# Patient Record
Sex: Male | Born: 1970
Health system: Southern US, Community
[De-identification: ages and names within clinical notes are randomized; demographics above are authoritative.]

## PROBLEM LIST (undated history)

## (undated) DIAGNOSIS — I739 Peripheral vascular disease, unspecified: Secondary | ICD-10-CM

## (undated) DIAGNOSIS — J45909 Unspecified asthma, uncomplicated: Secondary | ICD-10-CM

## (undated) HISTORY — DX: Peripheral vascular disease, unspecified: I73.9

## (undated) HISTORY — PX: OTHER SURGICAL HISTORY: SHX169

## (undated) HISTORY — DX: Unspecified asthma, uncomplicated: J45.909

---

## 2005-05-31 ENCOUNTER — Emergency Department (HOSPITAL_COMMUNITY): Admission: EM | Admit: 2005-05-31 | Discharge: 2005-05-31 | Payer: Self-pay | Admitting: Family Medicine

## 2005-06-08 ENCOUNTER — Ambulatory Visit: Payer: Self-pay | Admitting: Family Medicine

## 2005-06-15 ENCOUNTER — Ambulatory Visit: Payer: Self-pay | Admitting: *Deleted

## 2005-07-22 ENCOUNTER — Inpatient Hospital Stay (HOSPITAL_COMMUNITY): Admission: EM | Admit: 2005-07-22 | Discharge: 2005-07-26 | Payer: Self-pay | Admitting: Emergency Medicine

## 2005-07-29 ENCOUNTER — Ambulatory Visit: Payer: Self-pay | Admitting: Family Medicine

## 2005-11-15 ENCOUNTER — Emergency Department (HOSPITAL_COMMUNITY): Admission: EM | Admit: 2005-11-15 | Discharge: 2005-11-15 | Payer: Self-pay | Admitting: Family Medicine

## 2005-11-26 ENCOUNTER — Ambulatory Visit: Payer: Self-pay | Admitting: Family Medicine

## 2005-12-15 ENCOUNTER — Ambulatory Visit: Payer: Self-pay | Admitting: Family Medicine

## 2005-12-28 ENCOUNTER — Ambulatory Visit: Payer: Self-pay | Admitting: Nurse Practitioner

## 2006-03-24 ENCOUNTER — Ambulatory Visit: Payer: Self-pay | Admitting: Family Medicine

## 2006-03-28 ENCOUNTER — Ambulatory Visit: Payer: Self-pay | Admitting: Family Medicine

## 2006-04-25 ENCOUNTER — Ambulatory Visit: Payer: Self-pay | Admitting: Family Medicine

## 2006-11-16 IMAGING — CR DG CHEST 2V
2 series · 2 of 2 positions shown · non-contrast
Comparison: none

CLINICAL DATA: Chest pain.  
 CHEST - 2 VIEW:

[w chest pa]
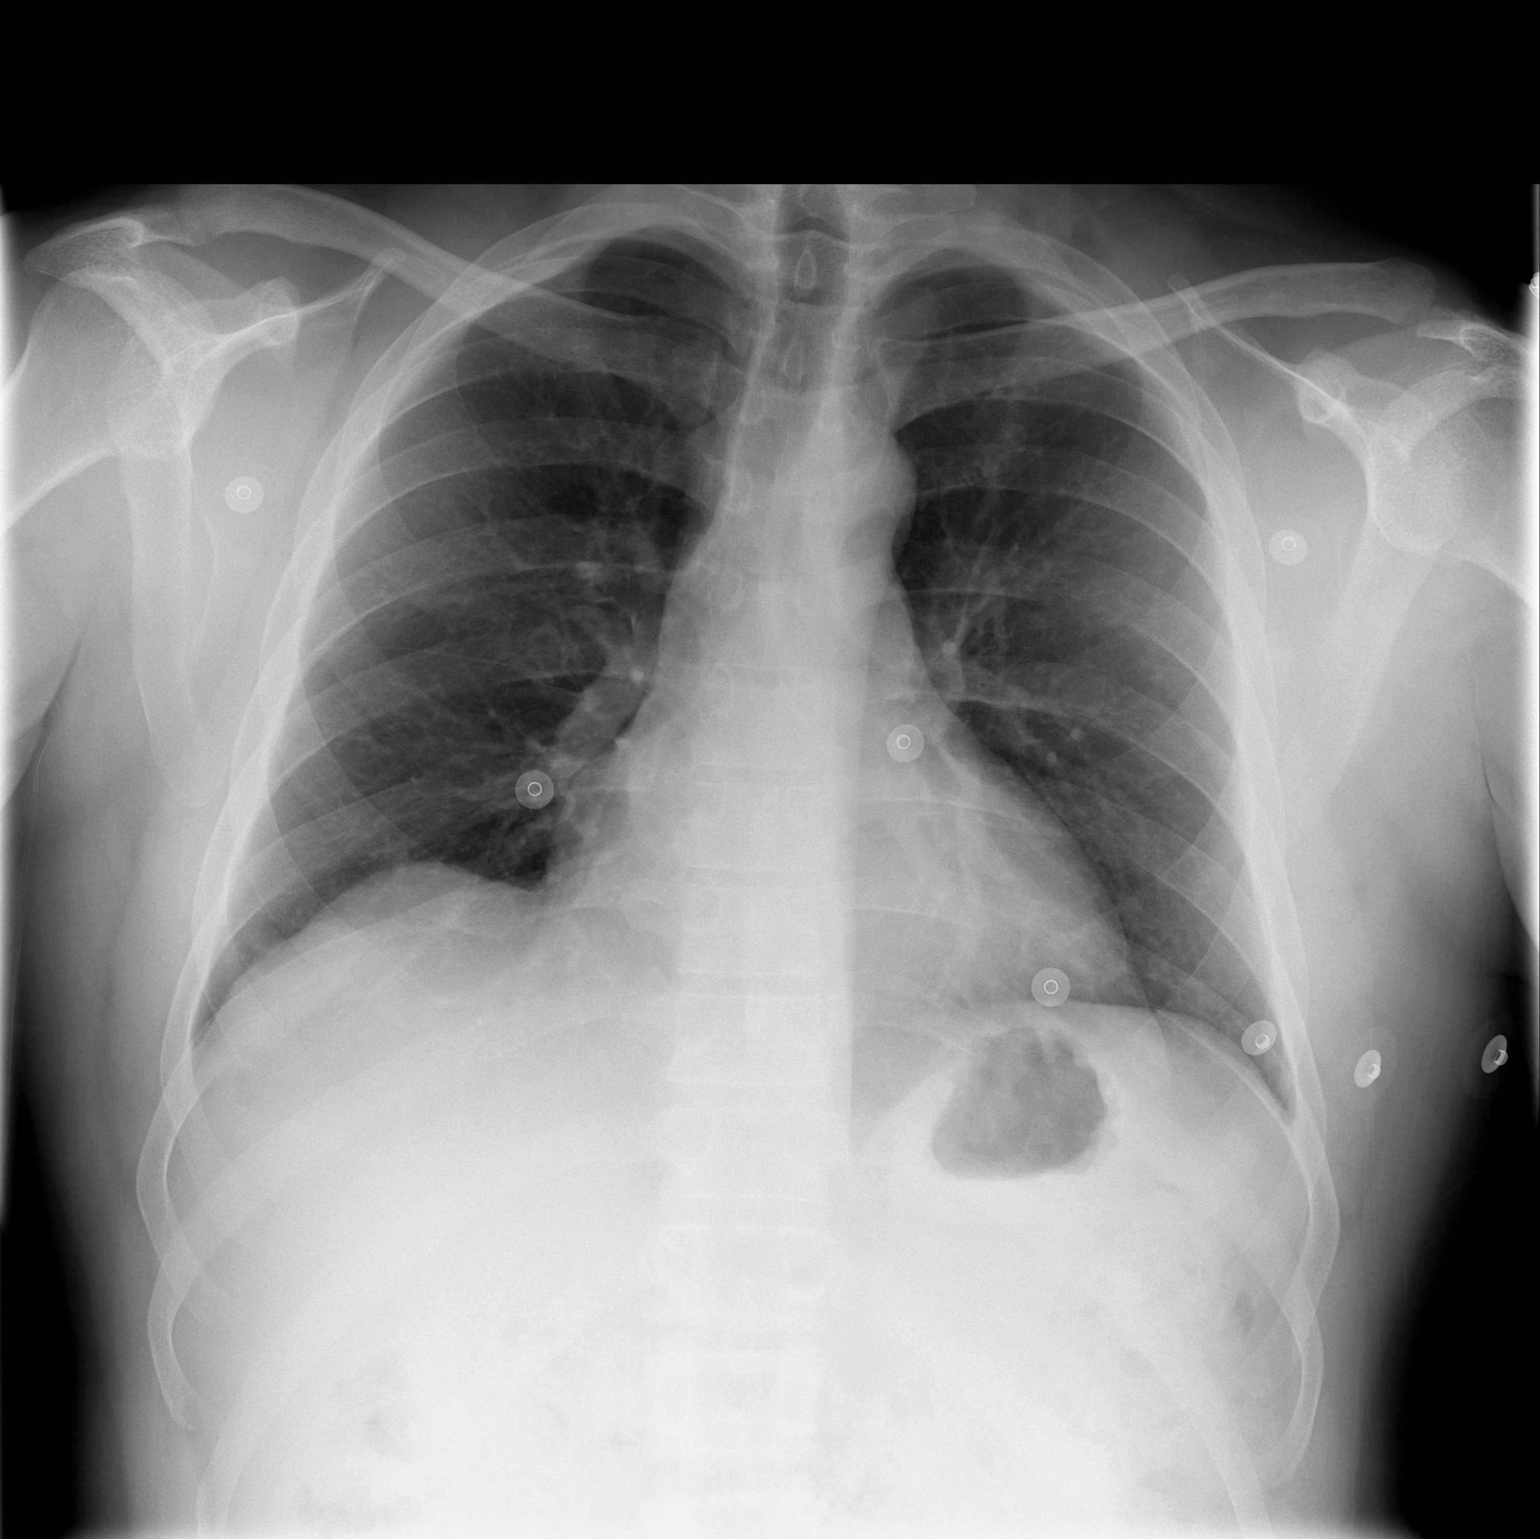

[w chest lat]
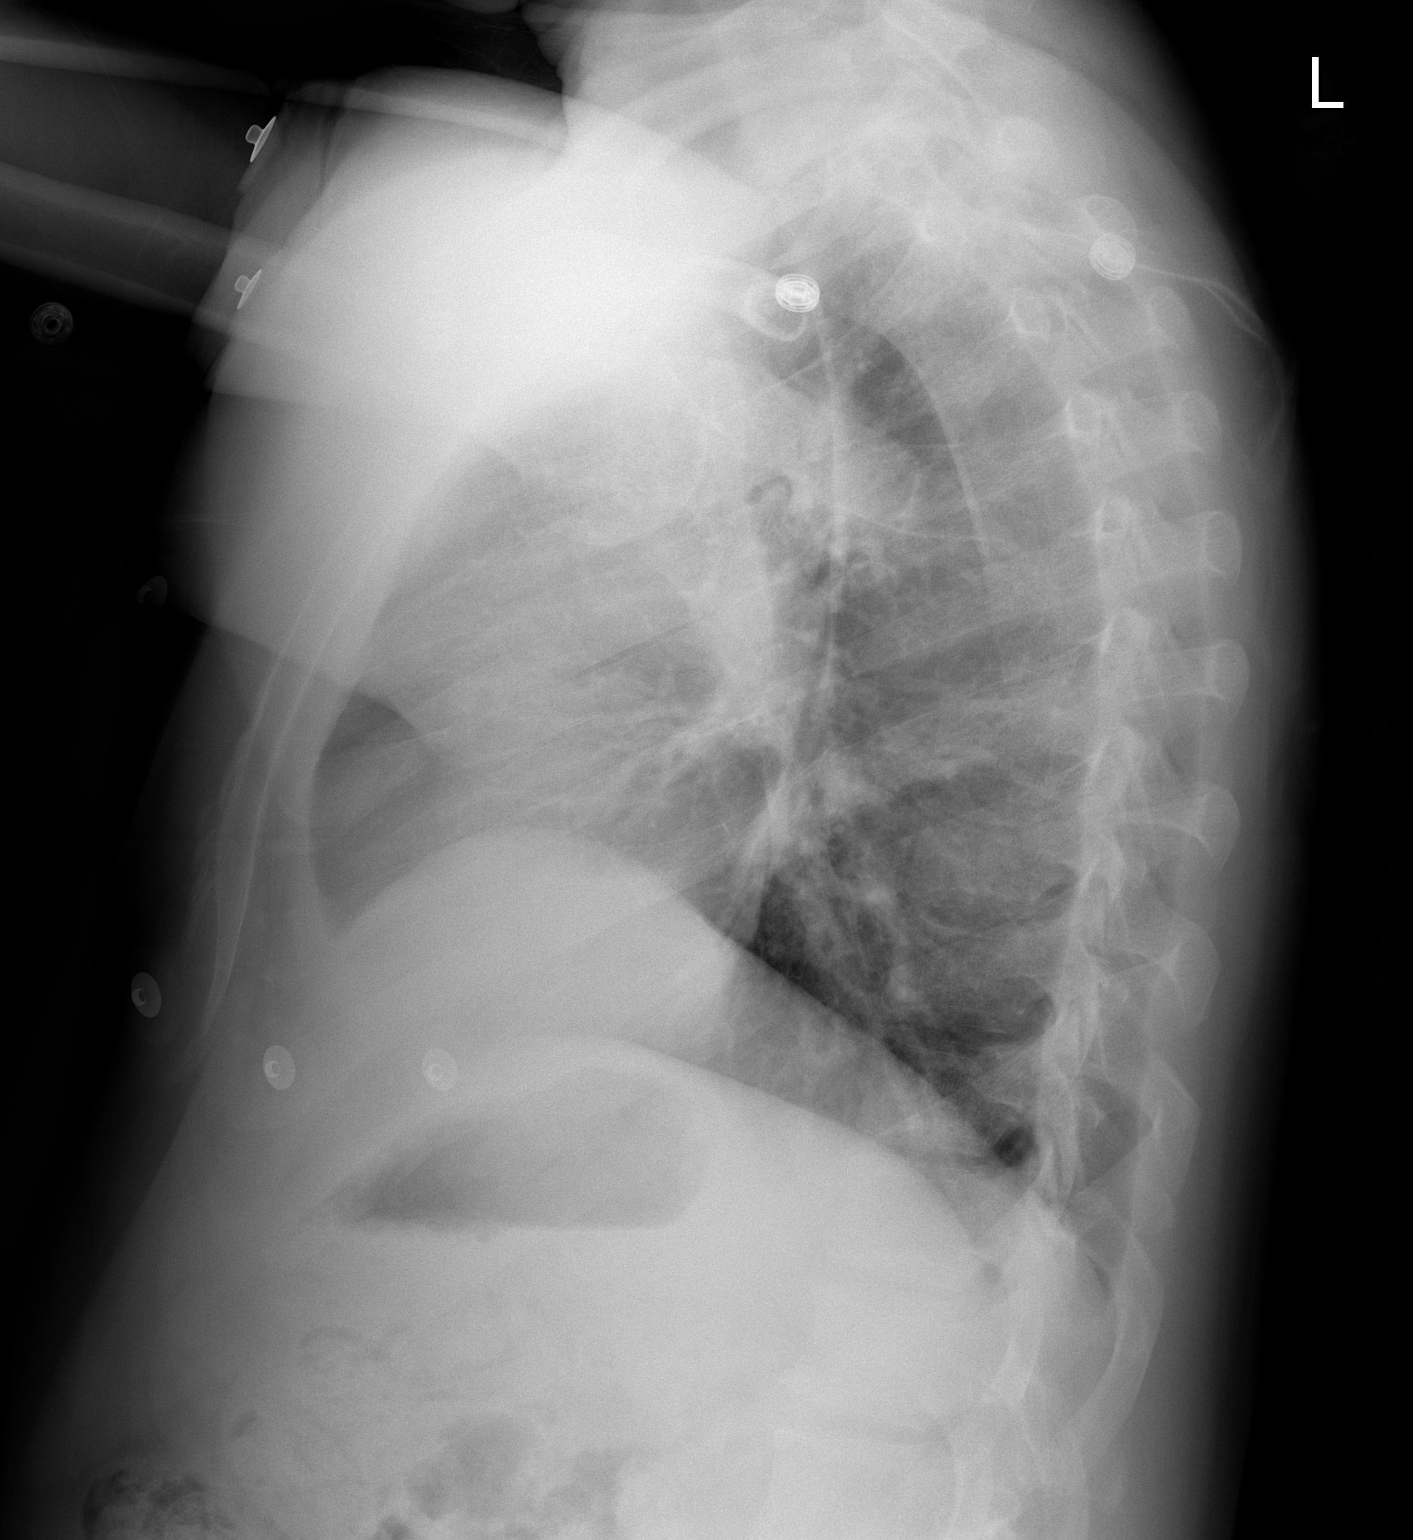

[2 of 2 positions shown; findings below may reference images not displayed]

FINDINGS: Lungs are clear.  There is cardiomegaly.  No pleural effusion.  No focal bony abnormality.
IMPRESSION: Cardiomegaly without acute cardiopulmonary disease.

## 2006-11-18 IMAGING — CT CT ANGIO CHEST
1 of 6 series · 13 of 30 positions shown · IV contrast (120 ML OMNI 300)
Comparison: none

CLINICAL DATA: Chest pain, dizziness

[Series 3: pe · axial · 0.70mm/px · z∈[-318,-74]mm · 13 of 464 slices shown]
[im 36/464  lung]
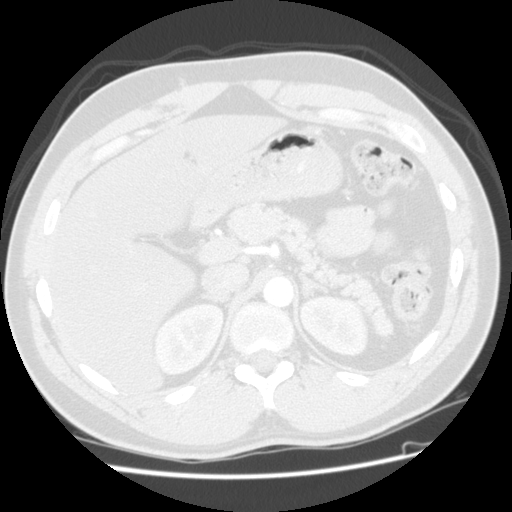
[im 72/464  mediastinal]
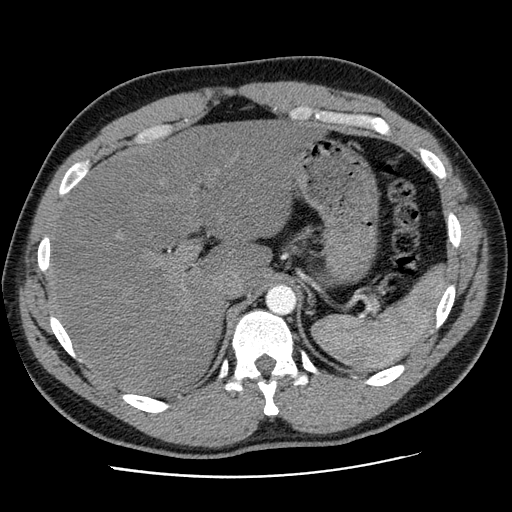
[im 107/464  lung]
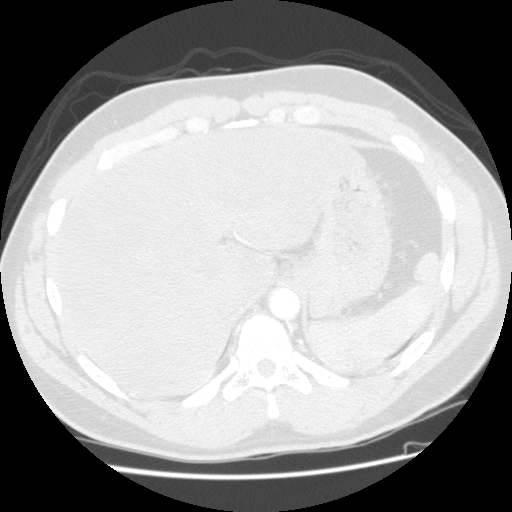
[im 143/464  mediastinal]
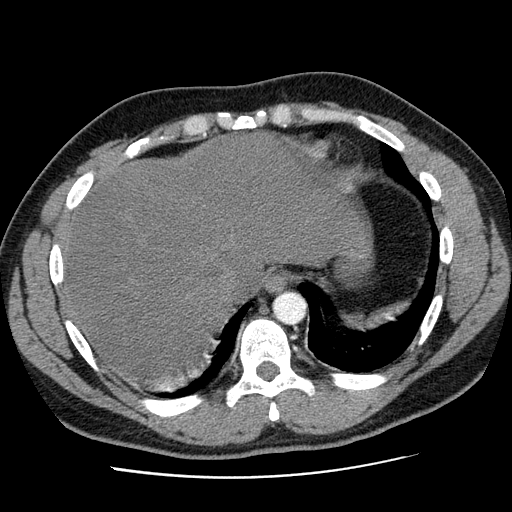
[im 179/464  lung]
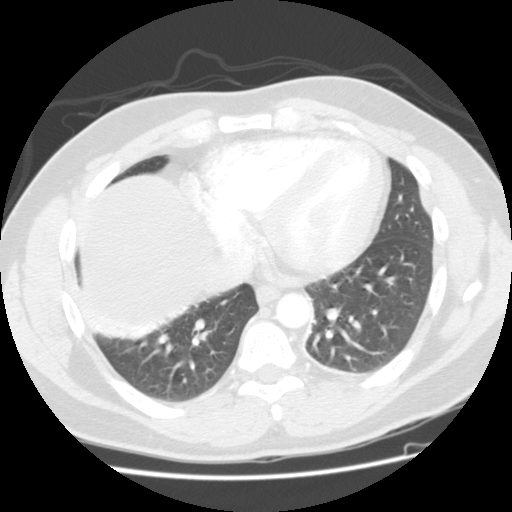
[im 214/464  mediastinal]
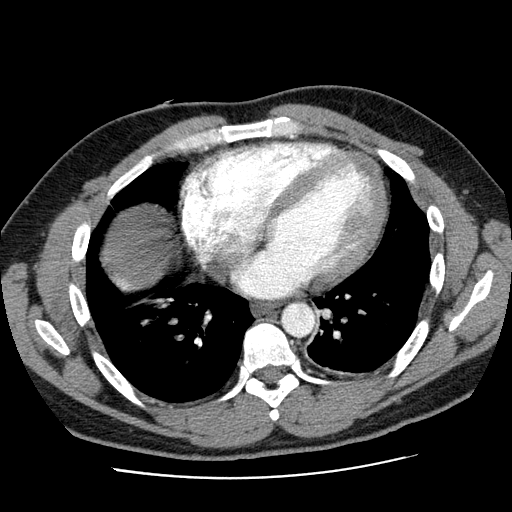
[im 217/464  lung]
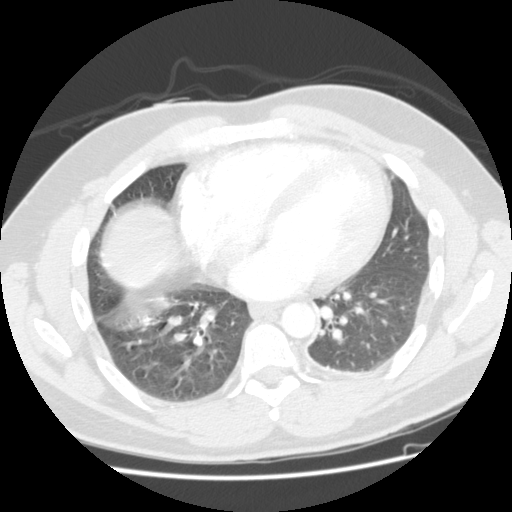
[im 250/464  mediastinal]
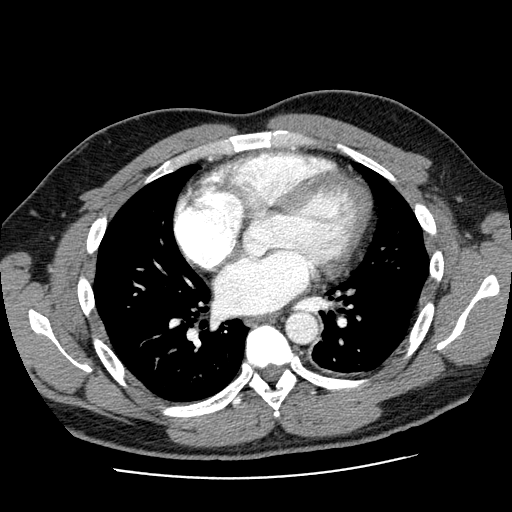
[im 285/464  lung]
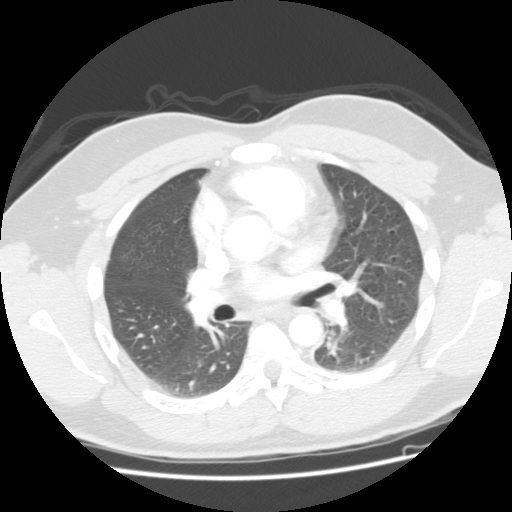
[im 321/464  mediastinal]
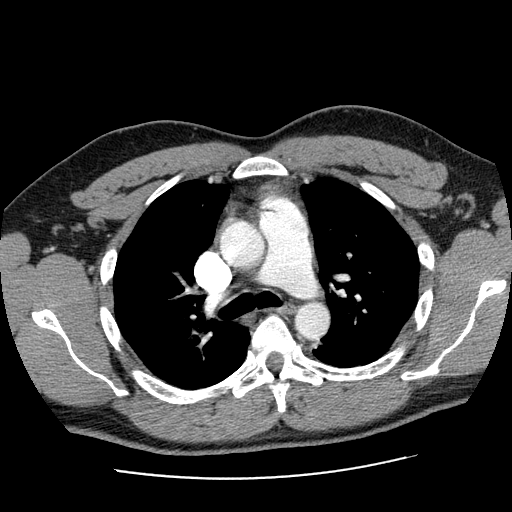
[im 357/464  lung]
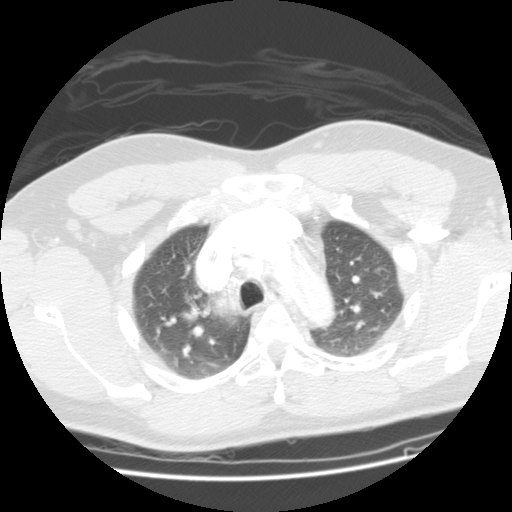
[im 392/464  mediastinal]
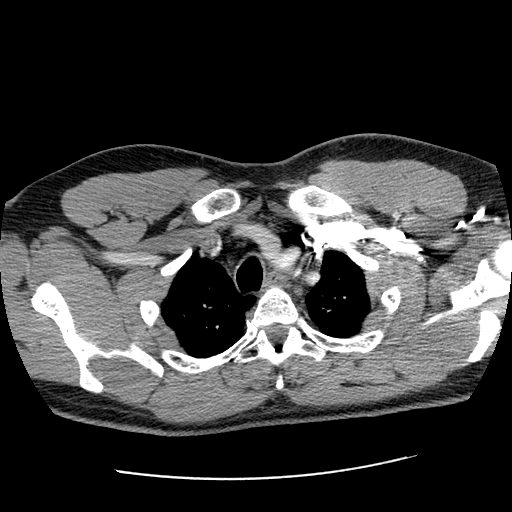
[im 428/464  lung]
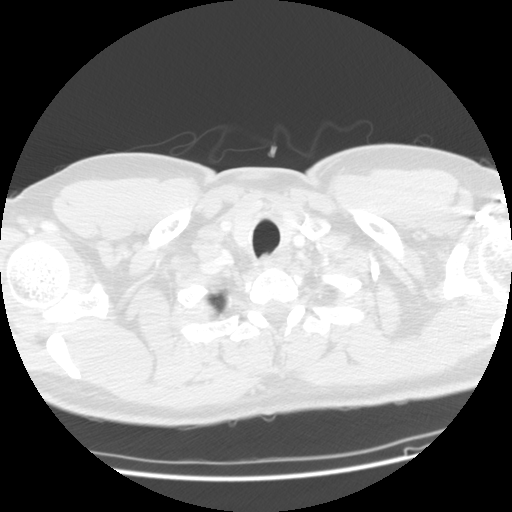

[13 of 30 positions shown; findings below may reference images not displayed]

CT angiogram chest with contrast:

Multidetector helical CT of the chest was obtained after 120 ml Omnipaque 300 
IV. CT multiplanar reconstructions were rendered to evaluate the vascular
anatomy. 
No previous for comparison. There is good contrast opacification of pulmonary
artery branches. Patient breathing degrades  some of the images. No convincing
filling defects to indicate acute PE. No pleural or pericardial effusion. No
hilar or mediastinal adenopathy. Mild   4 chamber cardiac enlargement.
Visualized upper abdomen shows a fatty liver. Linear scarring or atelectasis  
in the right middle lobe and anterior basal segment right lower lobe. Lungs are
otherwise clear. Coronal and sagittal reconstructions confirm the above
findings.
IMPRESSION: 1. Negative for acute PE.
2. Mild 4 chamber cardiac enlargement.
3. Fatty liver incidentally noted

## 2007-04-05 ENCOUNTER — Encounter (INDEPENDENT_AMBULATORY_CARE_PROVIDER_SITE_OTHER): Payer: Self-pay | Admitting: *Deleted

## 2017-09-29 DIAGNOSIS — E119 Type 2 diabetes mellitus without complications: Secondary | ICD-10-CM | POA: Diagnosis not present

## 2017-09-29 DIAGNOSIS — Z9114 Patient's other noncompliance with medication regimen: Secondary | ICD-10-CM | POA: Diagnosis not present

## 2017-09-29 DIAGNOSIS — Z720 Tobacco use: Secondary | ICD-10-CM | POA: Diagnosis not present

## 2017-09-29 DIAGNOSIS — R079 Chest pain, unspecified: Secondary | ICD-10-CM | POA: Diagnosis not present

## 2017-09-29 DIAGNOSIS — I10 Essential (primary) hypertension: Secondary | ICD-10-CM | POA: Diagnosis not present

## 2017-09-29 DIAGNOSIS — E785 Hyperlipidemia, unspecified: Secondary | ICD-10-CM | POA: Diagnosis not present

## 2017-09-30 DIAGNOSIS — E119 Type 2 diabetes mellitus without complications: Secondary | ICD-10-CM | POA: Diagnosis not present

## 2017-09-30 DIAGNOSIS — Z720 Tobacco use: Secondary | ICD-10-CM | POA: Diagnosis not present

## 2017-09-30 DIAGNOSIS — E785 Hyperlipidemia, unspecified: Secondary | ICD-10-CM | POA: Diagnosis not present

## 2017-09-30 DIAGNOSIS — I10 Essential (primary) hypertension: Secondary | ICD-10-CM | POA: Diagnosis not present

## 2017-09-30 DIAGNOSIS — R079 Chest pain, unspecified: Secondary | ICD-10-CM | POA: Diagnosis not present

## 2020-06-26 LAB — HM DIABETES EYE EXAM

## 2020-06-30 ENCOUNTER — Encounter: Payer: Self-pay | Admitting: Legal Medicine

## 2020-06-30 DIAGNOSIS — E782 Mixed hyperlipidemia: Secondary | ICD-10-CM

## 2020-06-30 DIAGNOSIS — I10 Essential (primary) hypertension: Secondary | ICD-10-CM

## 2020-06-30 DIAGNOSIS — E119 Type 2 diabetes mellitus without complications: Secondary | ICD-10-CM

## 2020-06-30 HISTORY — DX: Essential (primary) hypertension: I10

## 2020-06-30 HISTORY — DX: Mixed hyperlipidemia: E78.2

## 2020-06-30 HISTORY — DX: Type 2 diabetes mellitus without complications: E11.9

## 2020-07-03 ENCOUNTER — Encounter: Payer: Self-pay | Admitting: Legal Medicine

## 2020-08-07 ENCOUNTER — Encounter: Payer: Self-pay | Admitting: Legal Medicine

## 2020-08-07 ENCOUNTER — Other Ambulatory Visit: Payer: Self-pay

## 2020-08-07 ENCOUNTER — Ambulatory Visit: Payer: BC Managed Care – PPO | Admitting: Legal Medicine

## 2020-08-07 VITALS — BP 140/84 | HR 118 | Temp 97.5°F | Resp 16 | Ht 70.0 in | Wt 199.4 lb

## 2020-08-07 DIAGNOSIS — E782 Mixed hyperlipidemia: Secondary | ICD-10-CM | POA: Diagnosis not present

## 2020-08-07 DIAGNOSIS — I10 Essential (primary) hypertension: Secondary | ICD-10-CM

## 2020-08-07 DIAGNOSIS — F1210 Cannabis abuse, uncomplicated: Secondary | ICD-10-CM | POA: Insufficient documentation

## 2020-08-07 DIAGNOSIS — E1142 Type 2 diabetes mellitus with diabetic polyneuropathy: Secondary | ICD-10-CM | POA: Diagnosis not present

## 2020-08-07 DIAGNOSIS — F1411 Cocaine abuse, in remission: Secondary | ICD-10-CM | POA: Insufficient documentation

## 2020-08-07 DIAGNOSIS — N400 Enlarged prostate without lower urinary tract symptoms: Secondary | ICD-10-CM

## 2020-08-07 DIAGNOSIS — J45909 Unspecified asthma, uncomplicated: Secondary | ICD-10-CM | POA: Insufficient documentation

## 2020-08-07 DIAGNOSIS — J4520 Mild intermittent asthma, uncomplicated: Secondary | ICD-10-CM

## 2020-08-07 HISTORY — DX: Cocaine abuse, in remission: F14.11

## 2020-08-07 HISTORY — DX: Cannabis abuse, uncomplicated: F12.10

## 2020-08-07 MED ORDER — LISINOPRIL 10 MG PO TABS: 10.0000 mg | ORAL_TABLET | Freq: Every day | ORAL | 3 refills | Status: DC

## 2020-08-07 NOTE — Progress Notes (Signed)
New Patient Office Visit  Subjective:  Patient ID: Clayton Everett, male    DOB: Jul 24, 1970  Age: 50 y.o. MRN: [id number]  CC:  Chief Complaint  Patient presents with   New Patient (Initial Visit)    Patient here to re-etsablish.    HPI Clayton Everett presents for new patient with diabetes, hypertension ans smoking on no medicines.  He has a history of type 2 diabetes 2017, he is not on any medicines and no hospitalization  He is despondant today..needs PHQ9  Patient presents for follow up of hypertension.  Patient tolerating none well with side effects.  Patient was diagnosed with hypertension 2010 so has been treated for hypertension for 10 years.Patient is working on maintaining diet and exercise regimen and follows up as directed. Complication include none  .  Past Medical History:  Diagnosis Date   Asthma    Benign essential hypertension 06/30/2020   Diabetes mellitus, type 2 (HCC) 06/30/2020   Mixed hyperlipidemia 06/30/2020    Past Surgical History:  Procedure Laterality Date   none      Family History  Problem Relation Age of Onset   Congestive Heart Failure Mother    Diabetes type II Other    COPD Other    Hypertension Other    Congestive Heart Failure Other    Diabetes Father     Social History   Socioeconomic History   Marital status: Legally Separated    Spouse name: Not on file   Number of children: Not on file   Years of education: Not on file   Highest education level: Not on file  Occupational History   Occupation: Designer, television/film set  Tobacco Use   Smoking status: Current Some Day Smoker    Packs/day: 1.00    Types: Cigarettes   Smokeless tobacco: Never Used  Substance and Sexual Activity   Alcohol use: Yes    Alcohol/week: 1.0 standard drink    Types: 1 Cans of beer per week    Comment: weekly    Drug use: Never   Sexual activity: Not on file  Other Topics Concern   Not on file  Social History Narrative   Not on  file   Social Determinants of Health   Financial Resource Strain: Not on file  Food Insecurity: Not on file  Transportation Needs: Not on file  Physical Activity: Not on file  Stress: Not on file  Social Connections: Not on file  Intimate Partner Violence: Not on file    ROS Review of Systems  Constitutional: Negative for activity change, appetite change and fatigue.  HENT: Negative for congestion, rhinorrhea and sore throat.   Eyes: Negative for visual disturbance.  Respiratory: Negative for chest tightness and shortness of breath.   Cardiovascular: Negative for chest pain, palpitations and leg swelling.  Gastrointestinal: Negative for abdominal distention and abdominal pain.  Endocrine: Negative for polyuria.  Genitourinary: Negative for difficulty urinating and dysuria.  Musculoskeletal: Negative for arthralgias and back pain.  Skin: Negative.   Neurological: Negative.   Hematological: Negative.   Psychiatric/Behavioral: Positive for dysphoric mood.    Objective:   Today's Vitals: BP 140/84 (BP Location: Left Arm, Patient Position: Sitting, Cuff Size: Normal)    Pulse (!) 118    Temp (!) 97.5 F (36.4 C) (Temporal)    Resp 16    Ht 5\' 10"  (1.778 m)    Wt 199 lb 6.4 oz (90.4 kg)    SpO2 95%    BMI 28.61 kg/m  Physical Exam Vitals reviewed.  Constitutional:      General: He is not in acute distress.    Appearance: Normal appearance.  HENT:     Head: Normocephalic and atraumatic.     Right Ear: Tympanic membrane, ear canal and external ear normal.     Left Ear: Tympanic membrane, ear canal and external ear normal.     Mouth/Throat:     Mouth: Mucous membranes are moist.     Pharynx: Oropharynx is clear.  Eyes:     Extraocular Movements: Extraocular movements intact.     Conjunctiva/sclera: Conjunctivae normal.     Pupils: Pupils are equal, round, and reactive to light.     Comments: glasses  Cardiovascular:     Rate and Rhythm: Normal rate and regular rhythm.      Pulses: Normal pulses.     Heart sounds: Normal heart sounds.  Pulmonary:     Effort: Pulmonary effort is normal.     Breath sounds: Normal breath sounds.  Abdominal:     General: Abdomen is flat. Bowel sounds are normal. There is no distension.     Palpations: Abdomen is soft.     Tenderness: There is no abdominal tenderness.  Musculoskeletal:        General: No tenderness. Normal range of motion.     Cervical back: Normal range of motion.  Skin:    General: Skin is warm and dry.     Capillary Refill: Capillary refill takes less than 2 seconds.  Neurological:     Mental Status: He is alert and oriented to person, place, and time. Mental status is at baseline.     Sensory: Sensory deficit present.    Depression screen [facility] 2/9 08/07/2020  Decreased Interest 0  Down, Depressed, Hopeless 1  PHQ - 2 Score 1  Altered sleeping 1  Tired, decreased energy 1  Change in appetite 1  Feeling bad or failure about yourself  1  Trouble concentrating 1  Moving slowly or fidgety/restless 0  Suicidal thoughts 0  PHQ-9 Score 6  Difficult doing work/chores Not difficult at all     Assessment & Plan:   Diagnoses and all orders for this visit: Benign prostatic hyperplasia without lower urinary tract symptoms -     PSA AN INDIVIDUAL CARE PLAN for bph was established and reinforced today.  The patient's status was assessed using clinical findings on exam, labs, and other diagnostic testing. Patient's success at meeting treatment goals based on disease specific evidence-bassed guidelines and found to be in god control. RECOMMENDATIONS include maintining present medicines and treatment.  Mild intermittent asthma, unspecified whether complicated This patient has asthma mild and is on none.  Patient is not having a flair.  Chronic medicines include none. Addition new medicines none.  Asthma action plan is in place. He continues to smoke  Mixed hyperlipidemia -     Lipid panel AN INDIVIDUAL CARE  PLAN for hyperlipidemia/ cholesterol was established and reinforced today.  The patient's status was assessed using clinical findings on exam, lab and other diagnostic tests. The patient's disease status was assessed based on evidence-based guidelines and found to be poor controlled. MEDICATIONS were restarted. SELF MANAGEMENT GOALS have been discussed and patient's success at attaining the goal of low cholesterol was assessed. RECOMMENDATION given include regular exercise 3 days a week and low cholesterol/low fat diet. CLINICAL SUMMARY including written plan to identify barriers unique to the patient due to social or economic  reasons was discussed.  Benign  essential hypertension -     CBC with Differential/Platelet -     Comprehensive metabolic panel -     lisinopril (ZESTRIL) 10 MG tablet; Take 1 tablet (10 mg total) by mouth daily. An individual hypertension care plan was established and reinforced today.  The patient's status was assessed using clinical findings on exam and labs or diagnostic tests. The patient's success at meeting treatment goals on disease specific evidence-based guidelines and found to be poor controlled. Restarted BP medicines SELF MANAGEMENT: The patient and I together assessed ways to personally work towards obtaining the recommended goals. RECOMMENDATIONS: avoid decongestants found in common cold remedies, decrease consumption of alcohol, perform routine monitoring of BP with home BP cuff, exercise, reduction of dietary salt, take medicines as prescribed, try not to miss doses and quit smoking.  Regular exercise and maintaining a healthy weight is needed.  Stress reduction may help. A CLINICAL SUMMARY including written plan identify barriers to care unique to individual due to social or financial issues.  We attempt to mutually creat solutions for individual and family understanding.  Type 2 diabetes mellitus with diabetic polyneuropathy, without long-term current use of  insulin (HCC) -     Hemoglobin A1c An individual care plan for diabetes was established and reinforced today.  The patient's status was assessed using clinical findings on exam, labs and diagnostic testing. Patient success at meeting goals based on disease specific evidence-based guidelines and found to be poor controlled. Medications were assessed and patient's understanding of the medical issues , including barriers were assessed. started on metformin, I gave information on diet and diabetes care. Nurse discused DM Recommend adherence to a diabetic diet, a graduated exercise program, HgbA1c level is checked quarterly, and urine microalbumin performed yearly .  Annual mono-filament sensation testing performed. Lower blood pressure and control hyperlipidemia is important. Get annual eye exams and annual flu shots and smoking cessation discussed.  Self management goals were discussed.  Marijuana abuse, continuous He continues to smoke marijuana which may be why he is not concerned about his medical probes  Cocaine abuse in remission [facility]) He says he is in remission for 14 years, he does not go to NA or AA we discused Other orders -     Cardiovascular Risk Assessment   Outpatient Encounter Medications as of 08/07/2020  Medication Sig   lisinopril (ZESTRIL) 10 MG tablet Take 1 tablet (10 mg total) by mouth daily.   No facility-administered encounter medications on file as of 08/07/2020.    Follow-up: Return in about 1 month (around 09/07/2020).   Brent Bulla, MD

## 2020-08-07 NOTE — Patient Instructions (Signed)
Diabetes Mellitus and Foot Care Foot care is an important part of your health, especially when you have diabetes. Diabetes may cause you to have problems because of poor blood flow (circulation) to your feet and legs, which can cause your skin to:  Become thinner and drier.  Break more easily.  Heal more slowly.  Peel and crack. You may also have nerve damage (neuropathy) in your legs and feet, causing decreased feeling in them. This means that you may not notice minor injuries to your feet that could lead to more serious problems. Noticing and addressing any potential problems early is the best way to prevent future foot problems. How to care for your feet Foot hygiene  Wash your feet daily with warm water and mild soap. Do not use hot water. Then, pat your feet and the areas between your toes until they are completely dry. Do not soak your feet as this can dry your skin.  Trim your toenails straight across. Do not dig under them or around the cuticle. File the edges of your nails with an emery board or nail file.  Apply a moisturizing lotion or petroleum jelly to the skin on your feet and to dry, brittle toenails. Use lotion that does not contain alcohol and is unscented. Do not apply lotion between your toes.   Shoes and socks  Wear clean socks or stockings every day. Make sure they are not too tight. Do not wear knee-high stockings since they may decrease blood flow to your legs.  Wear shoes that fit properly and have enough cushioning. Always look in your shoes before you put them on to be sure there are no objects inside.  To break in new shoes, wear them for just a few hours a day. This prevents injuries on your feet. Wounds, scrapes, corns, and calluses  Check your feet daily for blisters, cuts, bruises, sores, and redness. If you cannot see the bottom of your feet, use a mirror or ask someone for help.  Do not cut corns or calluses or try to remove them with medicine.  If you  find a minor scrape, cut, or break in the skin on your feet, keep it and the skin around it clean and dry. You may clean these areas with mild soap and water. Do not clean the area with peroxide, alcohol, or iodine.  If you have a wound, scrape, corn, or callus on your foot, look at it several times a day to make sure it is healing and not infected. Check for: ? Redness, swelling, or pain. ? Fluid or blood. ? Warmth. ? Pus or a bad smell.   General tips  Do not cross your legs. This may decrease blood flow to your feet.  Do not use heating pads or hot water bottles on your feet. They may burn your skin. If you have lost feeling in your feet or legs, you may not know this is happening until it is too late.  Protect your feet from hot and cold by wearing shoes, such as at the beach or on hot pavement.  Schedule a complete foot exam at least once a year (annually) or more often if you have foot problems. Report any cuts, sores, or bruises to your health care provider immediately. Where to find more information  American Diabetes Association: www.diabetes.org  Association of Diabetes Care & Education Specialists: www.diabeteseducator.org Contact a health care provider if:  You have a medical condition that increases your risk of infection and   you have any cuts, sores, or bruises on your feet.  You have an injury that is not healing.  You have redness on your legs or feet.  You feel burning or tingling in your legs or feet.  You have pain or cramps in your legs and feet.  Your legs or feet are numb.  Your feet always feel cold.  You have pain around any toenails. Get help right away if:  You have a wound, scrape, corn, or callus on your foot and: ? You have pain, swelling, or redness that gets worse. ? You have fluid or blood coming from the wound, scrape, corn, or callus. ? Your wound, scrape, corn, or callus feels warm to the touch. ? You have pus or a bad smell coming from  the wound, scrape, corn, or callus. ? You have a fever. ? You have a red line going up your leg. Summary  Check your feet every day for blisters, cuts, bruises, sores, and redness.  Apply a moisturizing lotion or petroleum jelly to the skin on your feet and to dry, brittle toenails.  Wear shoes that fit properly and have enough cushioning.  If you have foot problems, report any cuts, sores, or bruises to your health care provider immediately.  Schedule a complete foot exam at least once a year (annually) or more often if you have foot problems. This information is not intended to replace advice given to you by your health care provider. Make sure you discuss any questions you have with your health care provider. Document Revised: 01/24/2020 Document Reviewed: 01/24/2020 Elsevier Patient Education  2021 Elsevier Inc.  

## 2020-08-08 ENCOUNTER — Other Ambulatory Visit: Payer: Self-pay | Admitting: Legal Medicine

## 2020-08-08 DIAGNOSIS — E782 Mixed hyperlipidemia: Secondary | ICD-10-CM

## 2020-08-08 DIAGNOSIS — E119 Type 2 diabetes mellitus without complications: Secondary | ICD-10-CM

## 2020-08-08 LAB — LIPID PANEL
Chol/HDL Ratio: 4.3 ratio (ref 0.0–5.0)
Cholesterol, Total: 158 mg/dL (ref 100–199)
HDL: 37 mg/dL — ABNORMAL LOW (ref >39)
LDL Chol Calc (NIH): 69 mg/dL (ref 0–99)
Triglycerides: 327 mg/dL — ABNORMAL HIGH (ref 0–149)
VLDL Cholesterol Cal: 52 mg/dL — ABNORMAL HIGH (ref 5–40)

## 2020-08-08 LAB — COMPREHENSIVE METABOLIC PANEL
ALT: 37 IU/L (ref 0–44)
AST: 22 IU/L (ref 0–40)
Albumin/Globulin Ratio: 1.7 (ref 1.2–2.2)
Albumin: 4.5 g/dL (ref 4.0–5.0)
Alkaline Phosphatase: 98 IU/L (ref 44–121)
BUN/Creatinine Ratio: 10 (ref 9–20)
BUN: 10 mg/dL (ref 6–24)
Bilirubin Total: <0.2 mg/dL (ref 0.0–1.2)
CO2: 22 mmol/L (ref 20–29)
Calcium: 9.1 mg/dL (ref 8.7–10.2)
Chloride: 101 mmol/L (ref 96–106)
Creatinine, Ser: 1.01 mg/dL (ref 0.76–1.27)
GFR calc Af Amer: 100 mL/min/{1.73_m2} (ref >59)
GFR calc non Af Amer: 86 mL/min/{1.73_m2} (ref >59)
Globulin, Total: 2.6 g/dL (ref 1.5–4.5)
Glucose: 460 mg/dL — ABNORMAL HIGH (ref 65–99)
Potassium: 4.4 mmol/L (ref 3.5–5.2)
Sodium: 138 mmol/L (ref 134–144)
Total Protein: 7.1 g/dL (ref 6.0–8.5)

## 2020-08-08 LAB — CBC WITH DIFFERENTIAL/PLATELET
Basophils Absolute: 0.1 10*3/uL (ref 0.0–0.2)
Basos: 1 %
EOS (ABSOLUTE): 0.1 10*3/uL (ref 0.0–0.4)
Eos: 1 %
Hematocrit: 48.9 % (ref 37.5–51.0)
Hemoglobin: 16.2 g/dL (ref 13.0–17.7)
Immature Grans (Abs): 0 10*3/uL (ref 0.0–0.1)
Immature Granulocytes: 1 %
Lymphocytes Absolute: 2.3 10*3/uL (ref 0.7–3.1)
Lymphs: 46 %
MCH: 29.3 pg (ref 26.6–33.0)
MCHC: 33.1 g/dL (ref 31.5–35.7)
MCV: 89 fL (ref 79–97)
Monocytes Absolute: 0.5 10*3/uL (ref 0.1–0.9)
Monocytes: 10 %
Neutrophils Absolute: 2.1 10*3/uL (ref 1.4–7.0)
Neutrophils: 41 %
Platelets: 175 10*3/uL (ref 150–450)
RBC: 5.52 x10E6/uL (ref 4.14–5.80)
RDW: 11.7 % (ref 11.6–15.4)
WBC: 5 10*3/uL (ref 3.4–10.8)

## 2020-08-08 LAB — CARDIOVASCULAR RISK ASSESSMENT

## 2020-08-08 LAB — HEMOGLOBIN A1C
Est. average glucose Bld gHb Est-mCnc: 301 mg/dL
Hgb A1c MFr Bld: 12.1 % — ABNORMAL HIGH (ref 4.8–5.6)

## 2020-08-08 LAB — PSA: Prostate Specific Ag, Serum: 0.3 ng/mL (ref 0.0–4.0)

## 2020-08-08 MED ORDER — METFORMIN HCL 500 MG PO TABS
500.0000 mg | ORAL_TABLET | Freq: Two times a day (BID) | ORAL | 3 refills | Status: DC
Start: 2020-08-08 — End: 2020-09-08

## 2020-08-08 MED ORDER — PRAVASTATIN SODIUM 40 MG PO TABS: 40.0000 mg | ORAL_TABLET | Freq: Every day | ORAL | 3 refills | Status: DC

## 2020-08-08 NOTE — Progress Notes (Signed)
CBC normal, glucose 460, trigycerides high 327, psa 0.3, hemoglobin A1c 12.1 extremely poor control of diabetes.Marland Kitchen at this range we start thinking about insulin.  We will start metformin and cholesterol medicines lp

## 2020-08-11 NOTE — Progress Notes (Signed)
all these were called in on 08/08/20 lp

## 2020-09-08 ENCOUNTER — Encounter: Payer: Self-pay | Admitting: Legal Medicine

## 2020-09-08 ENCOUNTER — Ambulatory Visit: Payer: BC Managed Care – PPO | Admitting: Legal Medicine

## 2020-09-08 ENCOUNTER — Other Ambulatory Visit: Payer: Self-pay

## 2020-09-08 DIAGNOSIS — E1169 Type 2 diabetes mellitus with other specified complication: Secondary | ICD-10-CM

## 2020-09-08 DIAGNOSIS — E119 Type 2 diabetes mellitus without complications: Secondary | ICD-10-CM | POA: Diagnosis not present

## 2020-09-08 DIAGNOSIS — I152 Hypertension secondary to endocrine disorders: Secondary | ICD-10-CM

## 2020-09-08 DIAGNOSIS — E1159 Type 2 diabetes mellitus with other circulatory complications: Secondary | ICD-10-CM | POA: Diagnosis not present

## 2020-09-08 DIAGNOSIS — E669 Obesity, unspecified: Secondary | ICD-10-CM

## 2020-09-08 HISTORY — DX: Obesity, unspecified: E66.9

## 2020-09-08 HISTORY — DX: Hypertension secondary to endocrine disorders: I15.2

## 2020-09-08 MED ORDER — METFORMIN HCL 1000 MG PO TABS: 500.0000 mg | ORAL_TABLET | Freq: Two times a day (BID) | ORAL | 2 refills | Status: DC

## 2020-09-08 NOTE — Progress Notes (Signed)
Subjective:  Patient ID: Clayton Everett, male    DOB: 10/04/1970  Age: 50 y.o. MRN: [id number]  Chief Complaint  Patient presents with  . Diabetes    HPI: new diabetes, yesterday BS 334 and 188. Today 234  Patient present with type 2 diabetes.  Specifically, this is type 2, noninsulin requiring diabetes, complicated by hypertension and hypercholesterolemia.  Compliance with treatment has been good; patient take medicines as directed, maintains diet and exercise regimen, follows up as directed, and is keeping glucose diary.  Date of  diagnosis 2022.  Depression screen has been performed.Tobacco screen nonsmoker. Current medicines for diabetes metformin.  Patient is on lisinopril for renal protection and pravastatin for cholesterol control.  Patient performs foot exams daily and last ophthalmologic exam was no. Current Outpatient Medications on File Prior to Visit  Medication Sig Dispense Refill  . lisinopril (ZESTRIL) 10 MG tablet Take 1 tablet (10 mg total) by mouth daily. 90 tablet 3  . pravastatin (PRAVACHOL) 40 MG tablet Take 1 tablet (40 mg total) by mouth daily. 90 tablet 3   No current facility-administered medications on file prior to visit.   Past Medical History:  Diagnosis Date  . Asthma   . Benign essential hypertension 06/30/2020  . Diabetes mellitus, type 2 (HCC) 06/30/2020  . Mixed hyperlipidemia 06/30/2020   Past Surgical History:  Procedure Laterality Date  . none      Family History  Problem Relation Age of Onset  . Congestive Heart Failure Mother   . Diabetes type II Other   . COPD Other   . Hypertension Other   . Congestive Heart Failure Other   . Diabetes Father    Social History   Socioeconomic History  . Marital status: Legally Separated    Spouse name: Not on file  . Number of children: Not on file  . Years of education: Not on file  . Highest education level: Not on file  Occupational History  . Occupation: Designer, television/film set  Tobacco Use  . Smoking  status: Current Some Day Smoker    Packs/day: 1.00    Types: Cigarettes  . Smokeless tobacco: Never Used  Substance and Sexual Activity  . Alcohol use: Yes    Alcohol/week: 1.0 standard drink    Types: 1 Cans of beer per week    Comment: weekly   . Drug use: Never  . Sexual activity: Not on file  Other Topics Concern  . Not on file  Social History Narrative  . Not on file   Social Determinants of Health   Financial Resource Strain: Not on file  Food Insecurity: Not on file  Transportation Needs: Not on file  Physical Activity: Not on file  Stress: Not on file  Social Connections: Not on file    Review of Systems  Constitutional: Negative for activity change and appetite change.  HENT: Negative for congestion, sneezing and sore throat.   Eyes: Negative for visual disturbance.  Respiratory: Negative for chest tightness and shortness of breath.   Cardiovascular: Negative for chest pain, palpitations and leg swelling.  Gastrointestinal: Negative for abdominal distention, abdominal pain and anal bleeding.  Endocrine: Negative for polyuria.  Genitourinary: Negative for difficulty urinating and frequency.  Musculoskeletal: Negative for arthralgias and back pain.  Skin: Negative.   Neurological: Negative.   Psychiatric/Behavioral: Negative.      Objective:  BP 120/78 (BP Location: Left Arm, Patient Position: Sitting, Cuff Size: Normal)   Pulse (!) 102   Temp 97.8 F (36.6 C) (  Temporal)   Resp 16   Ht 5\' 10"  (1.778 m)   Wt 205 lb 3.2 oz (93.1 kg)   SpO2 98%   BMI 29.44 kg/m   BP/Weight 09/08/2020 08/07/2020  Systolic BP 120 140  Diastolic BP 78 84  Wt. (Lbs) 205.2 199.4  BMI 29.44 28.61    Physical Exam Vitals reviewed.  Constitutional:      General: He is not in acute distress.    Appearance: Normal appearance.  HENT:     Head: Normocephalic and atraumatic.     Right Ear: Tympanic membrane, ear canal and external ear normal.     Left Ear: Tympanic membrane,  ear canal and external ear normal.     Mouth/Throat:     Mouth: Mucous membranes are moist.     Pharynx: Oropharynx is clear.  Eyes:     Extraocular Movements: Extraocular movements intact.     Conjunctiva/sclera: Conjunctivae normal.     Pupils: Pupils are equal, round, and reactive to light.  Cardiovascular:     Rate and Rhythm: Normal rate and regular rhythm.     Pulses: Normal pulses.     Heart sounds: Normal heart sounds. No murmur heard. No gallop.   Pulmonary:     Effort: Pulmonary effort is normal. No respiratory distress.     Breath sounds: Normal breath sounds. No rales.  Abdominal:     General: Abdomen is flat. Bowel sounds are normal. There is no distension.     Palpations: Abdomen is soft.     Tenderness: There is no abdominal tenderness.  Musculoskeletal:     Cervical back: Normal range of motion and neck supple.  Skin:    General: Skin is warm and dry.     Capillary Refill: Capillary refill takes less than 2 seconds.  Neurological:     General: No focal deficit present.     Mental Status: He is alert and oriented to person, place, and time. Mental status is at baseline.       Lab Results  Component Value Date   WBC 5.0 08/07/2020   HGB 16.2 08/07/2020   HCT 48.9 08/07/2020   PLT 175 08/07/2020   GLUCOSE 460 (H) 08/07/2020   CHOL 158 08/07/2020   TRIG 327 (H) 08/07/2020   HDL 37 (L) 08/07/2020   LDLCALC 69 08/07/2020   ALT 37 08/07/2020   AST 22 08/07/2020   NA 138 08/07/2020   K 4.4 08/07/2020   CL 101 08/07/2020   CREATININE 1.01 08/07/2020   BUN 10 08/07/2020   CO2 22 08/07/2020   HGBA1C 12.1 (H) 08/07/2020      Assessment & Plan:   1. Obesity, diabetes, and hypertension syndrome (HCC) Patient BS is improving.  Need to increased to metformin 1000mg  bid.  Follow up 2 months is stable.         I spent 20 minutes dedicated to the care of this patient on the date of this encounter to include face-to-face time with the patient, as well  as:  Follow-up: Return in about 2 months (around 11/06/2020) for fasting.  An After Visit Summary was printed and given to the patient.  , MD [facility] [phone]

## 2020-11-06 ENCOUNTER — Ambulatory Visit: Payer: BC Managed Care – PPO | Admitting: Legal Medicine

## 2020-11-07 ENCOUNTER — Other Ambulatory Visit: Payer: Self-pay

## 2020-11-07 ENCOUNTER — Encounter: Payer: Self-pay | Admitting: Legal Medicine

## 2020-11-07 ENCOUNTER — Ambulatory Visit: Payer: BC Managed Care – PPO | Admitting: Legal Medicine

## 2020-11-07 VITALS — BP 136/84 | HR 87 | Temp 96.4°F | Ht 64.0 in | Wt 204.0 lb

## 2020-11-07 DIAGNOSIS — Z716 Tobacco abuse counseling: Secondary | ICD-10-CM

## 2020-11-07 DIAGNOSIS — I10 Essential (primary) hypertension: Secondary | ICD-10-CM

## 2020-11-07 DIAGNOSIS — E1169 Type 2 diabetes mellitus with other specified complication: Secondary | ICD-10-CM | POA: Diagnosis not present

## 2020-11-07 DIAGNOSIS — E782 Mixed hyperlipidemia: Secondary | ICD-10-CM

## 2020-11-07 DIAGNOSIS — J4520 Mild intermittent asthma, uncomplicated: Secondary | ICD-10-CM | POA: Diagnosis not present

## 2020-11-07 DIAGNOSIS — E1159 Type 2 diabetes mellitus with other circulatory complications: Secondary | ICD-10-CM | POA: Diagnosis not present

## 2020-11-07 DIAGNOSIS — I152 Hypertension secondary to endocrine disorders: Secondary | ICD-10-CM

## 2020-11-07 DIAGNOSIS — R079 Chest pain, unspecified: Secondary | ICD-10-CM | POA: Insufficient documentation

## 2020-11-07 DIAGNOSIS — E669 Obesity, unspecified: Secondary | ICD-10-CM | POA: Diagnosis not present

## 2020-11-07 DIAGNOSIS — R072 Precordial pain: Secondary | ICD-10-CM

## 2020-11-07 DIAGNOSIS — F1210 Cannabis abuse, uncomplicated: Secondary | ICD-10-CM | POA: Diagnosis not present

## 2020-11-07 DIAGNOSIS — N529 Male erectile dysfunction, unspecified: Secondary | ICD-10-CM

## 2020-11-07 HISTORY — DX: Chest pain, unspecified: R07.9

## 2020-11-07 MED ORDER — VARENICLINE TARTRATE 1 MG PO TABS: 1.0000 mg | ORAL_TABLET | Freq: Two times a day (BID) | ORAL | 2 refills | Status: DC

## 2020-11-07 MED ORDER — SILDENAFIL CITRATE 50 MG PO TABS: 50.0000 mg | ORAL_TABLET | Freq: Every day | ORAL | 3 refills | Status: DC | PRN

## 2020-11-07 NOTE — Progress Notes (Signed)
Subjective:  Patient ID: Clayton Everett, male    DOB: 10-18-1970  Age: 50 y.o. MRN: [id number]  Chief Complaint  Patient presents with  . Diabetes  . Hyperlipidemia  . Hypertension    HPI : chronic visit  Patient present with type 2 diabetes.  Specifically, this is type 2, noninsulin requiring diabetes, complicated by hypertension and hypercholesterolemia.  Compliance with treatment has been good; patient take medicines as directed, maintains diet and exercise regimen, follows up as directed, and is keeping glucose diary.  Date of  diagnosis 2010.  Depression screen has been performed.Tobacco screen smoker. Current medicines for diabetes metformin.  Patient is on lisinopril for renal protection and prvastatin for cholesterol control.  Patient performs foot exams daily and last ophthalmologic exam was yes.  Patient presents for follow up of hypertension.  Patient tolerating lisinopril well with side effects.  Patient was diagnosed with hypertension 2010 so has been treated for hypertension for 10 years.Patient is working on maintaining diet and exercise regimen and follows up as directed. Complication include none.  Patient presents with hyperlipidemia.  Compliance with treatment has been good; patient takes medicines as directed, maintains low cholesterol diet, follows up as directed, and maintains exercise regimen.  Patient is using pravastatin without problems.   Smoking for 35 years 1 ppd.  He wants to quit.  Stopping alone has not worked.  Patient has ED several months. Current Outpatient Medications on File Prior to Visit  Medication Sig Dispense Refill  . lisinopril (ZESTRIL) 10 MG tablet Take 1 tablet (10 mg total) by mouth daily. 90 tablet 3  . metFORMIN (GLUCOPHAGE) 1000 MG tablet Take 0.5 tablets (500 mg total) by mouth 2 (two) times daily with a meal. 180 tablet 2  . pravastatin (PRAVACHOL) 40 MG tablet Take 1 tablet (40 mg total) by mouth daily. 90 tablet 3   No current  facility-administered medications on file prior to visit.   Past Medical History:  Diagnosis Date  . Asthma   . Benign essential hypertension 06/30/2020  . Diabetes mellitus, type 2 (HCC) 06/30/2020  . Mixed hyperlipidemia 06/30/2020   Past Surgical History:  Procedure Laterality Date  . none      Family History  Problem Relation Age of Onset  . Congestive Heart Failure Mother   . Diabetes type II Other   . COPD Other   . Hypertension Other   . Congestive Heart Failure Other   . Diabetes Father    Social History   Socioeconomic History  . Marital status: Legally Separated    Spouse name: Not on file  . Number of children: Not on file  . Years of education: Not on file  . Highest education level: Not on file  Occupational History  . Occupation: Designer, television/film set  Tobacco Use  . Smoking status: Current Some Day Smoker    Packs/day: 1.00    Types: Cigarettes  . Smokeless tobacco: Never Used  Substance and Sexual Activity  . Alcohol use: Yes    Alcohol/week: 1.0 standard drink    Types: 1 Cans of beer per week    Comment: weekly   . Drug use: Never  . Sexual activity: Not on file  Other Topics Concern  . Not on file  Social History Narrative  . Not on file   Social Determinants of Health   Financial Resource Strain: Not on file  Food Insecurity: Not on file  Transportation Needs: Not on file  Physical Activity: Not on file  Stress: Not  on file  Social Connections: Not on file    Review of Systems  Constitutional: Negative for chills, diaphoresis, fatigue and fever.  HENT: Negative for congestion, ear pain and sore throat.   Respiratory: Negative for cough, chest tightness and shortness of breath.   Cardiovascular: Positive for chest pain (lasts 5 mintes, no radiation or sweats). Negative for leg swelling.  Gastrointestinal: Negative for abdominal pain, constipation, diarrhea, nausea and vomiting.  Genitourinary: Negative for dysuria and urgency.  Musculoskeletal:  Negative for arthralgias and myalgias.  Neurological: Negative for dizziness and headaches.  Psychiatric/Behavioral: Negative for dysphoric mood.     Objective:  BP 136/84   Pulse 87   Temp (!) 96.4 F (35.8 C)   Ht 5\' 4"  (1.626 m)   Wt 204 lb (92.5 kg)   SpO2 100%   BMI 35.02 kg/m   BP/Weight 11/07/2020 09/08/2020 08/07/2020  Systolic BP 136 120 140  Diastolic BP 84 78 84  Wt. (Lbs) 204 205.2 199.4  BMI 35.02 29.44 28.61    Physical Exam Vitals reviewed.  Constitutional:      Appearance: Normal appearance.  HENT:     Head: Normocephalic and atraumatic.     Right Ear: Tympanic membrane, ear canal and external ear normal.     Left Ear: Tympanic membrane, ear canal and external ear normal.     Mouth/Throat:     Mouth: Mucous membranes are moist.     Pharynx: Oropharynx is clear.  Eyes:     Extraocular Movements: Extraocular movements intact.     Conjunctiva/sclera: Conjunctivae normal.     Pupils: Pupils are equal, round, and reactive to light.  Cardiovascular:     Rate and Rhythm: Regular rhythm.     Pulses: Normal pulses.     Heart sounds: Normal heart sounds. No murmur heard. No gallop.   Pulmonary:     Effort: Pulmonary effort is normal. No respiratory distress.     Breath sounds: Normal breath sounds. No rales.  Abdominal:     General: Abdomen is flat. Bowel sounds are normal. There is no distension.     Palpations: Abdomen is soft.     Tenderness: There is no abdominal tenderness.  Musculoskeletal:        General: Normal range of motion.  Skin:    General: Skin is warm and dry.     Capillary Refill: Capillary refill takes less than 2 seconds.  Neurological:     General: No focal deficit present.     Mental Status: He is alert and oriented to person, place, and time.     Sensory: Sensory deficit present.     Diabetic Foot Exam - Simple   Simple Foot Form Diabetic Foot exam was performed with the following findings: Yes 11/07/2020 10:27 AM  Visual  Inspection No deformities, no ulcerations, no other skin breakdown bilaterally: Yes Sensation Testing Intact to touch and monofilament testing bilaterally: Yes Pulse Check Posterior Tibialis and Dorsalis pulse intact bilaterally: Yes Comments      Lab Results  Component Value Date   WBC 6.9 11/07/2020   HGB 14.6 11/07/2020   HCT 42.9 11/07/2020   PLT 274 11/07/2020   GLUCOSE 198 (H) 11/07/2020   CHOL 139 11/07/2020   TRIG 108 11/07/2020   HDL 54 11/07/2020   LDLCALC 65 11/07/2020   ALT 23 11/07/2020   AST 17 11/07/2020   NA 138 11/07/2020   K 5.0 11/07/2020   CL 99 11/07/2020   CREATININE 0.79 11/07/2020  BUN 11 11/07/2020   CO2 20 11/07/2020   HGBA1C 9.9 (H) 11/07/2020      Assessment & Plan:   Diagnoses and all orders for this visit: Benign essential hypertension -     CBC with Differential/Platelet -     Comprehensive metabolic panel An individual hypertension care plan was established and reinforced today.  The patient's status was assessed using clinical findings on exam and labs or diagnostic tests. The patient's success at meeting treatment goals on disease specific evidence-based guidelines and found to be fair controlled. SELF MANAGEMENT: The patient and I together assessed ways to personally work towards obtaining the recommended goals. RECOMMENDATIONS: avoid decongestants found in common cold remedies, decrease consumption of alcohol, perform routine monitoring of BP with home BP cuff, exercise, reduction of dietary salt, take medicines as prescribed, try not to miss doses and quit smoking.  Regular exercise and maintaining a healthy weight is needed.  Stress reduction may help. A CLINICAL SUMMARY including written plan identify barriers to care unique to individual due to social or financial issues.  We attempt to mutually creat solutions for individual and family understanding.  Obesity, diabetes, and hypertension syndrome (HCC) -     Hemoglobin A1c An  individual care plan for diabetes was established and reinforced today.  The patient's status was assessed using clinical findings on exam, labs and diagnostic testing. Patient success at meeting goals based on disease specific evidence-based guidelines and found to be fair controlled. Medications were assessed and patient's understanding of the medical issues , including barriers were assessed. Recommend adherence to a diabetic diet, a graduated exercise program, HgbA1c level is checked quarterly, and urine microalbumin performed yearly .  Annual mono-filament sensation testing performed. Lower blood pressure and control hyperlipidemia is important. Get annual eye exams and annual flu shots and smoking cessation discussed.  Self management goals were discussed.  Mild intermittent asthma, unspecified whether complicated This patient has asthma mild and is on albuterol.  Patient is not having a flair.  Chronic medicines include albuterol. Addition new medicines none.  Asthma action plan is in place.   Marijuana abuse, continuous Patient smokes marijuana  Mixed hyperlipidemia -     Lipid panel AN INDIVIDUAL CARE PLAN for hyperlipidemia/ cholesterol was established and reinforced today.  The patient's status was assessed using clinical findings on exam, lab and other diagnostic tests. The patient's disease status was assessed based on evidence-based guidelines and found to be fair controlled. MEDICATIONS were reviewed. SELF MANAGEMENT GOALS have been discussed and patient's success at attaining the goal of low cholesterol was assessed. RECOMMENDATION given include regular exercise 3 days a week and low cholesterol/low fat diet. CLINICAL SUMMARY including written plan to identify barriers unique to the patient due to social or economic  reasons was discussed.  Precordial pain -     Ambulatory referral to Cardiology Patient is having intermittant precordial pain with activity, he has multiple reisk  factors, he needs cardiology referral  Vasculogenic erectile dysfunction, unspecified vasculogenic erectile dysfunction type -     PSA -     sildenafil (VIAGRA) 50 MG tablet; Take 1 tablet (50 mg total) by mouth daily as needed for erectile dysfunction. Patient has ED, try viagra, if he has CAD , he will need to stop  Encounter for smoking cessation counseling -     varenicline (CHANTIX) 1 MG tablet; Take 1 tablet (1 mg total) by mouth 2 (two) times daily. Individual plan was given to patient based on exam, history  and other tests and using evidence based criteria for care for smoking cessation.  We discussed behavioral changes to help cessation and offered medicines and counseling to aid in quitting.  Medical consequences of tobacco use were explained. Other orders -     Cardiovascular Risk Assessment         Follow-up: Return in about 1 month (around 12/07/2020) for smoking.  An After Visit Summary was printed and given to the patient.  Brent Bulla, MD [facility] [phone]

## 2020-11-08 LAB — CARDIOVASCULAR RISK ASSESSMENT

## 2020-11-08 LAB — LIPID PANEL
Chol/HDL Ratio: 2.6 ratio (ref 0.0–5.0)
Cholesterol, Total: 139 mg/dL (ref 100–199)
HDL: 54 mg/dL (ref >39)
LDL Chol Calc (NIH): 65 mg/dL (ref 0–99)
Triglycerides: 108 mg/dL (ref 0–149)
VLDL Cholesterol Cal: 20 mg/dL (ref 5–40)

## 2020-11-08 LAB — CBC WITH DIFFERENTIAL/PLATELET
Basophils Absolute: 0.1 10*3/uL (ref 0.0–0.2)
Basos: 1 %
EOS (ABSOLUTE): 0.2 10*3/uL (ref 0.0–0.4)
Eos: 3 %
Hematocrit: 42.9 % (ref 37.5–51.0)
Hemoglobin: 14.6 g/dL (ref 13.0–17.7)
Immature Grans (Abs): 0 10*3/uL (ref 0.0–0.1)
Immature Granulocytes: 0 %
Lymphocytes Absolute: 2.4 10*3/uL (ref 0.7–3.1)
Lymphs: 35 %
MCH: 29.9 pg (ref 26.6–33.0)
MCHC: 34 g/dL (ref 31.5–35.7)
MCV: 88 fL (ref 79–97)
Monocytes Absolute: 0.7 10*3/uL (ref 0.1–0.9)
Monocytes: 10 %
Neutrophils Absolute: 3.5 10*3/uL (ref 1.4–7.0)
Neutrophils: 51 %
Platelets: 274 10*3/uL (ref 150–450)
RBC: 4.88 x10E6/uL (ref 4.14–5.80)
RDW: 11.9 % (ref 11.6–15.4)
WBC: 6.9 10*3/uL (ref 3.4–10.8)

## 2020-11-08 LAB — COMPREHENSIVE METABOLIC PANEL
ALT: 23 IU/L (ref 0–44)
AST: 17 IU/L (ref 0–40)
Albumin/Globulin Ratio: 1.8 (ref 1.2–2.2)
Albumin: 4.6 g/dL (ref 3.8–4.9)
Alkaline Phosphatase: 83 IU/L (ref 44–121)
BUN/Creatinine Ratio: 14 (ref 9–20)
BUN: 11 mg/dL (ref 6–24)
Bilirubin Total: 0.3 mg/dL (ref 0.0–1.2)
CO2: 20 mmol/L (ref 20–29)
Calcium: 9.7 mg/dL (ref 8.7–10.2)
Chloride: 99 mmol/L (ref 96–106)
Creatinine, Ser: 0.79 mg/dL (ref 0.76–1.27)
Globulin, Total: 2.5 g/dL (ref 1.5–4.5)
Glucose: 198 mg/dL — ABNORMAL HIGH (ref 65–99)
Potassium: 5 mmol/L (ref 3.5–5.2)
Sodium: 138 mmol/L (ref 134–144)
Total Protein: 7.1 g/dL (ref 6.0–8.5)
eGFR: 108 mL/min/{1.73_m2} (ref >59)

## 2020-11-08 LAB — PSA: Prostate Specific Ag, Serum: 0.4 ng/mL (ref 0.0–4.0)

## 2020-11-08 LAB — HEMOGLOBIN A1C
Est. average glucose Bld gHb Est-mCnc: 237 mg/dL
Hgb A1c MFr Bld: 9.9 % — ABNORMAL HIGH (ref 4.8–5.6)

## 2020-11-09 NOTE — Progress Notes (Signed)
CBC normal, glucose 198, kidney tests normal, Liver tests normal, A1c 9.9 still very high, Cholesterol good, PSA o.4 normal, we need to add ozempic or trulicity- we will need appointment for this, we want A1c to be < 8.0 lp

## 2020-11-10 ENCOUNTER — Other Ambulatory Visit: Payer: Self-pay

## 2020-11-10 DIAGNOSIS — E119 Type 2 diabetes mellitus without complications: Secondary | ICD-10-CM

## 2020-11-10 MED ORDER — METFORMIN HCL 1000 MG PO TABS: 1000.0000 mg | ORAL_TABLET | Freq: Two times a day (BID) | ORAL | 3 refills | Status: DC

## 2020-11-19 ENCOUNTER — Other Ambulatory Visit: Payer: Self-pay

## 2020-11-21 ENCOUNTER — Encounter: Payer: Self-pay | Admitting: Cardiology

## 2020-11-21 ENCOUNTER — Other Ambulatory Visit: Payer: Self-pay

## 2020-11-21 ENCOUNTER — Ambulatory Visit: Payer: BC Managed Care – PPO | Admitting: Cardiology

## 2020-11-21 VITALS — BP 140/100 | HR 95 | Ht 70.0 in | Wt 204.2 lb

## 2020-11-21 DIAGNOSIS — R0789 Other chest pain: Secondary | ICD-10-CM | POA: Diagnosis not present

## 2020-11-21 DIAGNOSIS — E785 Hyperlipidemia, unspecified: Secondary | ICD-10-CM | POA: Diagnosis not present

## 2020-11-21 DIAGNOSIS — Z8249 Family history of ischemic heart disease and other diseases of the circulatory system: Secondary | ICD-10-CM

## 2020-11-21 DIAGNOSIS — Z720 Tobacco use: Secondary | ICD-10-CM | POA: Diagnosis not present

## 2020-11-21 DIAGNOSIS — I10 Essential (primary) hypertension: Secondary | ICD-10-CM | POA: Diagnosis not present

## 2020-11-21 DIAGNOSIS — E119 Type 2 diabetes mellitus without complications: Secondary | ICD-10-CM

## 2020-11-21 MED ORDER — VALSARTAN 80 MG PO TABS: 80.0000 mg | ORAL_TABLET | Freq: Every day | ORAL | 3 refills | Status: DC

## 2020-11-21 NOTE — Patient Instructions (Addendum)
Medication Instructions:  Your physician has recommended you make the following change in your medication:  STOP: Lisinopril START: Valsartan 80 mg once daily *If you need a refill on your cardiac medications before your next appointment, please call your pharmacy*   Lab Work: None If you have labs (blood work) drawn today and your tests are completely normal, you will receive your results only by: Marland Kitchen MyChart Message (if you have MyChart) OR . A paper copy in the mail If you have any lab test that is abnormal or we need to change your treatment, we will call you to review the results.   Testing/Procedures: Your physician has requested that you have a lexiscan myoview. For further information please visit [email or web address] Please follow instruction sheet, as given.    Follow-Up: At [facility], you and your health needs are our priority.  As part of our continuing mission to provide you with exceptional heart care, we have created designated Provider Care Teams.  These Care Teams include your primary Cardiologist (physician) and Advanced Practice Providers (APPs -  Physician Assistants and Nurse Practitioners) who all work together to provide you with the care you need, when you need it.  We recommend signing up for the patient portal called "MyChart".  Sign up information is provided on this After Visit Summary.  MyChart is used to connect with patients for Virtual Visits (Telemedicine).  Patients are able to view lab/test results, encounter notes, upcoming appointments, etc.  Non-urgent messages can be sent to your provider as well.   To learn more about what you can do with MyChart, go to [email or web address].    Your next appointment:   3 month(s)  The format for your next appointment:   In Person  Provider:   Thomasene Ripple, DO   Other Instructions   [facility] [address] [place], [place] [address] Phone:  [phone]    Please  arrive 15 minutes prior to your appointment time for registration and insurance purposes.  The test will take approximately 3 to 4 hours to complete; you may bring reading material.  If someone comes with you to your appointment, they will need to remain in the main lobby due to limited space in the testing area. **If you are pregnant or breastfeeding, please notify the nuclear lab prior to your appointment**  How to prepare for your Myocardial Perfusion Test: . Do not eat or drink 3 hours prior to your test, except you may have water. . Do not consume products containing caffeine (regular or decaffeinated) 12 hours prior to your test. (ex: coffee, chocolate, sodas, tea). . Do bring a list of your current medications with you.  If not listed below, you may take your medications as normal. . Do wear comfortable clothes (no dresses or overalls) and walking shoes, tennis shoes preferred (No heels or open toe shoes are allowed). . Do NOT wear cologne, perfume, aftershave, or lotions (deodorant is allowed). . If these instructions are not followed, your test will have to be rescheduled.  Please report to [address] for your test.  If you have questions or concerns about your appointment, you can call the [facility] at [phone].  If you cannot keep your appointment, please provide 24 hours notification to the Nuclear Lab, to avoid a possible $50 charge to your account.

## 2020-11-21 NOTE — Progress Notes (Signed)
Cardiology Office Note:    Date:  11/21/2020   ID:  Clayton Everett, DOB Mar 31, 1971, MRN [id number]  PCP:  Abigail Miyamoto, MD  Cardiologist:  None  Electrophysiologist:  None   Referring MD: Abigail Miyamoto,*   I had chest pain a few times  History of Present Illness:    Clayton Everett is a 50 y.o. male with a hx of diabetes mellitus, hypertension, hyperlipidemia, family history of coronary artery disease in his mother.  The patient presents to be evaluated at the request of his primary care doctor.  He notes that he has had intermittent chest discomfort.  He describes this as a midsternal dull and painful/pressure sensation.  It last for few minutes and then resolved.  Nothing makes it better or worse.  He denies any shortness of breath.  Past Medical History:  Diagnosis Date  . Asthma   . Benign essential hypertension 06/30/2020  . Diabetes mellitus, type 2 (HCC) 06/30/2020  . Mixed hyperlipidemia 06/30/2020    Past Surgical History:  Procedure Laterality Date  . none      Current Medications: Current Meds  Medication Sig  . metFORMIN (GLUCOPHAGE) 1000 MG tablet Take 1 tablet (1,000 mg total) by mouth 2 (two) times daily with a meal.  . pravastatin (PRAVACHOL) 40 MG tablet Take 1 tablet (40 mg total) by mouth daily.  . sildenafil (VIAGRA) 50 MG tablet Take 1 tablet (50 mg total) by mouth daily as needed for erectile dysfunction.  . valsartan (DIOVAN) 80 MG tablet Take 1 tablet (80 mg total) by mouth daily.  . [DISCONTINUED] lisinopril (ZESTRIL) 10 MG tablet Take 1 tablet (10 mg total) by mouth daily.  . [DISCONTINUED] metFORMIN (GLUCOPHAGE) 500 MG tablet Take 1 tablet by mouth 2 (two) times daily.  . [DISCONTINUED] varenicline (CHANTIX) 1 MG tablet Take 1 tablet (1 mg total) by mouth 2 (two) times daily.     Allergies:   Penicillins   Social History   Socioeconomic History  . Marital status: Legally Separated    Spouse name: Not on file  . Number of  children: Not on file  . Years of education: Not on file  . Highest education level: Not on file  Occupational History  . Occupation: Designer, television/film set  Tobacco Use  . Smoking status: Current Some Day Smoker    Packs/day: 1.00    Types: Cigarettes  . Smokeless tobacco: Never Used  Substance and Sexual Activity  . Alcohol use: Yes    Alcohol/week: 1.0 standard drink    Types: 1 Cans of beer per week    Comment: weekly   . Drug use: Never  . Sexual activity: Not on file  Other Topics Concern  . Not on file  Social History Narrative  . Not on file   Social Determinants of Health   Financial Resource Strain: Not on file  Food Insecurity: Not on file  Transportation Needs: Not on file  Physical Activity: Not on file  Stress: Not on file  Social Connections: Not on file     Family History: The patient's family history includes COPD in an other family member; Congestive Heart Failure in his mother and another family member; Diabetes in his father; Diabetes type II in an other family member; Hypertension in an other family member.  ROS:   Review of Systems  Constitution: Negative for decreased appetite, fever and weight gain.  HENT: Negative for congestion, ear discharge, hoarse voice and sore throat.   Eyes: Negative for discharge,  redness, vision loss in right eye and visual halos.  Cardiovascular: Reports chest pain, dyspnea on exertion.  Negative for leg swelling, orthopnea and palpitations.  Respiratory: Negative for cough, hemoptysis, shortness of breath and snoring.   Endocrine: Negative for heat intolerance and polyphagia.  Hematologic/Lymphatic: Negative for bleeding problem. Does not bruise/bleed easily.  Skin: Negative for flushing, nail changes, rash and suspicious lesions.  Musculoskeletal: Negative for arthritis, joint pain, muscle cramps, myalgias, neck pain and stiffness.  Gastrointestinal: Negative for abdominal pain, bowel incontinence, diarrhea and excessive appetite.   Genitourinary: Negative for decreased libido, genital sores and incomplete emptying.  Neurological: Negative for brief paralysis, focal weakness, headaches and loss of balance.  Psychiatric/Behavioral: Negative for altered mental status, depression and suicidal ideas.  Allergic/Immunologic: Negative for HIV exposure and persistent infections.    EKGs/Labs/Other Studies Reviewed:    The following studies were reviewed today:   EKG:  The ekg ordered today demonstrates sinus rhythm, 95 bpm.  Recent Labs: 11/07/2020: ALT 23; BUN 11; Creatinine, Ser 0.79; Hemoglobin 14.6; Platelets 274; Potassium 5.0; Sodium 138  Recent Lipid Panel    Component Value Date/Time   CHOL 139 11/07/2020 1050   TRIG 108 11/07/2020 1050   HDL 54 11/07/2020 1050   CHOLHDL 2.6 11/07/2020 1050   LDLCALC 65 11/07/2020 1050    Physical Exam:    VS:  BP (!) 140/100 (BP Location: Right Arm)   Pulse 95   Ht 5\' 10"  (1.778 m)   Wt 204 lb 3.2 oz (92.6 kg)   SpO2 98%   BMI 29.30 kg/m     Wt Readings from Last 3 Encounters:  11/21/20 204 lb 3.2 oz (92.6 kg)  11/07/20 204 lb (92.5 kg)  09/08/20 205 lb 3.2 oz (93.1 kg)     GEN: Well nourished, well developed in no acute distress HEENT: Normal NECK: No JVD; No carotid bruits LYMPHATICS: No lymphadenopathy CARDIAC: S1S2 noted,RRR, no murmurs, rubs, gallops RESPIRATORY:  Clear to auscultation without rales, wheezing or rhonchi  ABDOMEN: Soft, non-tender, non-distended, +bowel sounds, no guarding. EXTREMITIES: No edema, No cyanosis, no clubbing MUSCULOSKELETAL:  No deformity  SKIN: Warm and dry NEUROLOGIC:  Alert and oriented x 3, non-focal PSYCHIATRIC:  Normal affect, good insight  ASSESSMENT:    1. Other chest pain   2. Hypertension, unspecified type   3. Hyperlipidemia, unspecified hyperlipidemia type   4. Tobacco use   5. Family history of premature CAD   6. Type 2 diabetes mellitus without complication, without long-term current use of insulin  (HCC)    PLAN:     No chest pain the office today.  But the patient does have intermediate to high risk factors for coronary artery disease therefore an ischemic evaluation is in order here.  We discussed different testing options which include a coronary CTA the patient declined travel to get this testing done therefore we will pursue exercise nuclear stress test.  Educated patient about this testing he is agreeable to proceed with this.  He declines nitroglycerin in the setting of use of sildenafil.  He is hypertensive in the office today we will stop the lisinopril and started patient on valsartan 80 mg daily.  Continue patient his pravastatin.  The patient was counseled on tobacco cessation today for 5 minutes.  Counseling included reviewing the risks of smoking tobacco products, how it impacts the patient's current medical diagnoses and different strategies for quitting.  Pharmacotherapy to aid in tobacco cessation was not prescribed today. The patient coordinate with  primary care provider.  The patient was also advised to call   1-800-QUIT-NOW [phone])[phone]) for additional help with quitting smoking.  This is being managed by his primary care doctor.  No adjustments for antidiabetic medications were made today.   The patient is in agreement with the above plan. The patient left the office in stable condition.  The patient will follow up in   Medication Adjustments/Labs and Tests Ordered: Current medicines are reviewed at length with the patient today.  Concerns regarding medicines are outlined above.  Orders Placed This Encounter  Procedures  . Cardiac Stress Test: Informed Consent Details: Physician/Practitioner Attestation; Transcribe to consent form and obtain patient signature  . MYOCARDIAL PERFUSION IMAGING  . EKG 12-Lead   Meds ordered this encounter  Medications  . valsartan (DIOVAN) 80 MG tablet    Sig: Take 1 tablet (80 mg total) by mouth daily.    Dispense:  90  tablet    Refill:  3    Patient Instructions  Medication Instructions:  Your physician has recommended you make the following change in your medication:  STOP: Lisinopril START: Valsartan 80 mg once daily *If you need a refill on your cardiac medications before your next appointment, please call your pharmacy*   Lab Work: None If you have labs (blood work) drawn today and your tests are completely normal, you will receive your results only by: Marland Kitchen MyChart Message (if you have MyChart) OR . A paper copy in the mail If you have any lab test that is abnormal or we need to change your treatment, we will call you to review the results.   Testing/Procedures: Your physician has requested that you have a lexiscan myoview. For further information please visit [email or web address] Please follow instruction sheet, as given.    Follow-Up: At [facility], you and your health needs are our priority.  As part of our continuing mission to provide you with exceptional heart care, we have created designated Provider Care Teams.  These Care Teams include your primary Cardiologist (physician) and Advanced Practice Providers (APPs -  Physician Assistants and Nurse Practitioners) who all work together to provide you with the care you need, when you need it.  We recommend signing up for the patient portal called "MyChart".  Sign up information is provided on this After Visit Summary.  MyChart is used to connect with patients for Virtual Visits (Telemedicine).  Patients are able to view lab/test results, encounter notes, upcoming appointments, etc.  Non-urgent messages can be sent to your provider as well.   To learn more about what you can do with MyChart, go to [email or web address].    Your next appointment:   3 month(s)  The format for your next appointment:   In Person  Provider:   Thomasene Ripple, DO   Other Instructions   [facility] [address] [place], [place] [address] Phone:  [phone]    Please arrive 15 minutes prior to your appointment time for registration and insurance purposes.  The test will take approximately 3 to 4 hours to complete; you may bring reading material.  If someone comes with you to your appointment, they will need to remain in the main lobby due to limited space in the testing area. **If you are pregnant or breastfeeding, please notify the nuclear lab prior to your appointment**  How to prepare for your Myocardial Perfusion Test: . Do not eat or drink 3 hours prior to your test, except you may have water. Marland Kitchen  Do not consume products containing caffeine (regular or decaffeinated) 12 hours prior to your test. (ex: coffee, chocolate, sodas, tea). . Do bring a list of your current medications with you.  If not listed below, you may take your medications as normal. . Do wear comfortable clothes (no dresses or overalls) and walking shoes, tennis shoes preferred (No heels or open toe shoes are allowed). . Do NOT wear cologne, perfume, aftershave, or lotions (deodorant is allowed). . If these instructions are not followed, your test will have to be rescheduled.  Please report to [address] for your test.  If you have questions or concerns about your appointment, you can call the [facility] at [phone].  If you cannot keep your appointment, please provide 24 hours notification to the Nuclear Lab, to avoid a possible $50 charge to your account.      Adopting a Healthy Lifestyle.  Know what a healthy weight is for you (roughly BMI <25) and aim to maintain this   Aim for 7+ servings of fruits and vegetables daily   65-80+ fluid ounces of water or unsweet tea for healthy kidneys   Limit to max 1 drink of alcohol per day; avoid smoking/tobacco   Limit animal fats in diet for cholesterol and heart health - choose grass fed whenever available   Avoid highly  processed foods, and foods high in saturated/trans fats   Aim for low stress - take time to unwind and care for your mental health   Aim for 150 min of moderate intensity exercise weekly for heart health, and weights twice weekly for bone health   Aim for 7-9 hours of sleep daily   When it comes to diets, agreement about the perfect plan isnt easy to find, even among the experts. Experts at the [employer] developed an idea known as the Healthy Eating Plate. Just imagine a plate divided into logical, healthy portions.   The emphasis is on diet quality:   Load up on vegetables and fruits - one-half of your plate: Aim for color and variety, and remember that potatoes dont count.   Go for whole grains - one-quarter of your plate: Whole wheat, barley, wheat berries, quinoa, oats, brown rice, and foods made with them. If you want pasta, go with whole wheat pasta.   Protein power - one-quarter of your plate: Fish, chicken, beans, and nuts are all healthy, versatile protein sources. Limit red meat.   The diet, however, does go beyond the plate, offering a few other suggestions.   Use healthy plant oils, such as olive, canola, soy, corn, sunflower and peanut. Check the labels, and avoid partially hydrogenated oil, which have unhealthy trans fats.   If youre thirsty, drink water. Coffee and tea are good in moderation, but skip sugary drinks and limit milk and dairy products to one or two daily servings.   The type of carbohydrate in the diet is more important than the amount. Some sources of carbohydrates, such as vegetables, fruits, whole grains, and beans-are healthier than others.   Finally, stay active  Signed, Thomasene RippleKardie Dam Ashraf, DO  11/21/2020 8:26 PM    [facility]

## 2020-12-05 ENCOUNTER — Encounter: Payer: Self-pay | Admitting: Legal Medicine

## 2020-12-05 ENCOUNTER — Ambulatory Visit (INDEPENDENT_AMBULATORY_CARE_PROVIDER_SITE_OTHER): Payer: BC Managed Care – PPO

## 2020-12-05 ENCOUNTER — Other Ambulatory Visit: Payer: Self-pay

## 2020-12-05 ENCOUNTER — Ambulatory Visit: Payer: BC Managed Care – PPO | Admitting: Legal Medicine

## 2020-12-05 VITALS — BP 130/85 | HR 90 | Temp 97.7°F | Resp 16 | Ht 70.0 in | Wt 206.0 lb

## 2020-12-05 DIAGNOSIS — I10 Essential (primary) hypertension: Secondary | ICD-10-CM | POA: Diagnosis not present

## 2020-12-05 DIAGNOSIS — Z23 Encounter for immunization: Secondary | ICD-10-CM

## 2020-12-05 DIAGNOSIS — I152 Hypertension secondary to endocrine disorders: Secondary | ICD-10-CM

## 2020-12-05 DIAGNOSIS — E782 Mixed hyperlipidemia: Secondary | ICD-10-CM | POA: Diagnosis not present

## 2020-12-05 DIAGNOSIS — E1142 Type 2 diabetes mellitus with diabetic polyneuropathy: Secondary | ICD-10-CM

## 2020-12-05 DIAGNOSIS — E1159 Type 2 diabetes mellitus with other circulatory complications: Secondary | ICD-10-CM

## 2020-12-05 DIAGNOSIS — E1169 Type 2 diabetes mellitus with other specified complication: Secondary | ICD-10-CM

## 2020-12-05 DIAGNOSIS — E669 Obesity, unspecified: Secondary | ICD-10-CM

## 2020-12-05 NOTE — Progress Notes (Signed)
   Covid-19 Vaccination Clinic  Name:  Clayton Everett    MRN: [id number] DOB: [birth year]  12/05/2020  [name] was observed post Covid-19 immunization for 15 minutes without incident. He was provided with Vaccine Information Sheet and instruction to access the V-Safe system.   [name] was instructed to call 911 with any severe reactions post vaccine: Marland Kitchen Difficulty breathing  . Swelling of face and throat  . A fast heartbeat  . A bad rash all over body  . Dizziness and weakness   Immunizations Administered    Name Date Dose VIS Date Route   PFIZER Comrnaty(Gray TOP) Covid-19 Vaccine 12/05/2020 10:46 AM 0.3 mL 06/26/2020 Intramuscular   Manufacturer: [employer], [employer]   Lot: [id number]   NDC: [phone]

## 2020-12-05 NOTE — Progress Notes (Signed)
Subjective:  Patient ID: Clayton Everett, male    DOB: 06-11-1971  Age: 50 y.o. MRN: [id number]  Chief Complaint  Patient presents with  . Diabetes    Patient started metformin two months ago.    HPI: new diabetic started on metformin 2 months ago.He needs A1c.  He is on pravastatin and valsartan. BS from 110 and 200.  Patient presents for follow up of hypertension.  Patient tolerating lisinopril well with side effects.  Patient was diagnosed with hypertension 2010 so has been treated for hypertension for 10 years.Patient is working on maintaining diet and exercise regimen and follows up as directed. Complication include none.   Current Outpatient Medications on File Prior to Visit  Medication Sig Dispense Refill  . metFORMIN (GLUCOPHAGE) 1000 MG tablet Take 1 tablet (1,000 mg total) by mouth 2 (two) times daily with a meal. 180 tablet 3  . pravastatin (PRAVACHOL) 40 MG tablet Take 1 tablet (40 mg total) by mouth daily. 90 tablet 3  . sildenafil (VIAGRA) 50 MG tablet Take 1 tablet (50 mg total) by mouth daily as needed for erectile dysfunction. 10 tablet 3  . valsartan (DIOVAN) 80 MG tablet Take 1 tablet (80 mg total) by mouth daily. 90 tablet 3   No current facility-administered medications on file prior to visit.   Past Medical History:  Diagnosis Date  . Asthma   . Benign essential hypertension 06/30/2020  . Diabetes mellitus, type 2 (HCC) 06/30/2020  . Mixed hyperlipidemia 06/30/2020   Past Surgical History:  Procedure Laterality Date  . none      Family History  Problem Relation Age of Onset  . Congestive Heart Failure Mother   . Diabetes type II Other   . COPD Other   . Hypertension Other   . Congestive Heart Failure Other   . Diabetes Father    Social History   Socioeconomic History  . Marital status: Legally Separated    Spouse name: Not on file  . Number of children: Not on file  . Years of education: Not on file  . Highest education level: Not on file   Occupational History  . Occupation: Designer, television/film set  Tobacco Use  . Smoking status: Current Some Day Smoker    Packs/day: 1.50    Types: Cigarettes  . Smokeless tobacco: Never Used  Substance and Sexual Activity  . Alcohol use: Yes    Alcohol/week: 1.0 standard drink    Types: 1 Cans of beer per week    Comment: weekly   . Drug use: Yes    Frequency: 1.0 times per week    Types: Marijuana  . Sexual activity: Yes    Partners: Female  Other Topics Concern  . Not on file  Social History Narrative  . Not on file   Social Determinants of Health   Financial Resource Strain: Not on file  Food Insecurity: Not on file  Transportation Needs: Not on file  Physical Activity: Not on file  Stress: Not on file  Social Connections: Not on file    Review of Systems  Constitutional: Negative for activity change and appetite change.  HENT: Negative for dental problem and rhinorrhea.   Eyes: Negative for visual disturbance.  Respiratory: Negative for chest tightness and shortness of breath.   Cardiovascular: Negative for chest pain, palpitations and leg swelling.  Gastrointestinal: Negative for abdominal distention and abdominal pain.  Genitourinary: Negative for difficulty urinating and dysuria.  Musculoskeletal: Negative for arthralgias and back pain.  Psychiatric/Behavioral: Negative.  Objective:  BP 130/85   Pulse 90   Temp 97.7 F (36.5 C)   Resp 16   Ht 5\' 10"  (1.778 m)   Wt 206 lb (93.4 kg)   SpO2 98%   BMI 29.56 kg/m   BP/Weight 12/05/2020 11/21/2020 11/07/2020  Systolic BP 130 140 136  Diastolic BP 85 100 84  Wt. (Lbs) 206 204.2 204  BMI 29.56 29.3 35.02    Physical Exam Vitals reviewed.  Constitutional:      Appearance: Normal appearance.  HENT:     Head: Normocephalic.     Right Ear: Tympanic membrane normal.     Left Ear: Tympanic membrane normal.     Nose: Nose normal.     Mouth/Throat:     Mouth: Mucous membranes are moist.     Pharynx: Oropharynx is  clear.  Eyes:     Extraocular Movements: Extraocular movements intact.     Conjunctiva/sclera: Conjunctivae normal.     Pupils: Pupils are equal, round, and reactive to light.  Cardiovascular:     Rate and Rhythm: Normal rate and regular rhythm.     Pulses: Normal pulses.     Heart sounds: Normal heart sounds. No murmur heard. No gallop.   Pulmonary:     Effort: Pulmonary effort is normal. No respiratory distress.     Breath sounds: Normal breath sounds. No rales.  Abdominal:     General: Abdomen is flat. Bowel sounds are normal. There is no distension.     Palpations: Abdomen is soft.     Tenderness: There is no abdominal tenderness. There is no guarding.  Musculoskeletal:        General: Normal range of motion.     Cervical back: Normal range of motion and neck supple.  Skin:    General: Skin is warm.     Capillary Refill: Capillary refill takes less than 2 seconds.  Neurological:     General: No focal deficit present.     Mental Status: He is alert and oriented to person, place, and time.     Diabetic Foot Exam - Simple   Simple Foot Form Diabetic Foot exam was performed with the following findings: Yes 12/05/2020  9:29 AM  Visual Inspection No deformities, no ulcerations, no other skin breakdown bilaterally: Yes Sensation Testing Intact to touch and monofilament testing bilaterally: Yes Pulse Check Comments      Lab Results  Component Value Date   WBC 6.9 11/07/2020   HGB 14.6 11/07/2020   HCT 42.9 11/07/2020   PLT 274 11/07/2020   GLUCOSE 198 (H) 11/07/2020   CHOL 139 11/07/2020   TRIG 108 11/07/2020   HDL 54 11/07/2020   LDLCALC 65 11/07/2020   ALT 23 11/07/2020   AST 17 11/07/2020   NA 138 11/07/2020   K 5.0 11/07/2020   CL 99 11/07/2020   CREATININE 0.79 11/07/2020   BUN 11 11/07/2020   CO2 20 11/07/2020   HGBA1C 9.9 (H) 11/07/2020      Assessment & Plan:   1. Type 2 diabetes mellitus with diabetic polyneuropathy, without long-term current use  of insulin (HCC) An individual care plan for diabetes was established and reinforced today.  The patient's status was assessed using clinical findings on exam, labs and diagnostic testing. Patient success at meeting goals based on disease specific evidence-based guidelines and found to be improving controlled. Medications were assessed and patient's understanding of the medical issues , including barriers were assessed. Recommend adherence to a diabetic diet, a  graduated exercise program, HgbA1c level is checked quarterly, and urine microalbumin performed yearly .  Annual mono-filament sensation testing performed. Lower blood pressure and control hyperlipidemia is important. Get annual eye exams and annual flu shots and smoking cessation discussed.  Self management goals were discussed.  2. Mixed hyperlipidemia An individual hypertension care plan was established and reinforced today.  The patient's status was assessed using clinical findings on exam and labs or diagnostic tests. The patient's success at meeting treatment goals on disease specific evidence-based guidelines and found to be fair controlled. SELF MANAGEMENT: The patient and I together assessed ways to personally work towards obtaining the recommended goals. RECOMMENDATIONS: avoid decongestants found in common cold remedies, decrease consumption of alcohol, perform routine monitoring of BP with home BP cuff, exercise, reduction of dietary salt, take medicines as prescribed, try not to miss doses and quit smoking.  Regular exercise and maintaining a healthy weight is needed.  Stress reduction may help. A CLINICAL SUMMARY including written plan identify barriers to care unique to individual due to social or financial issues.  We attempt to mutually creat solutions for individual and family understanding.  3. Benign essential hypertension An individual hypertension care plan was established and reinforced today.  The patient's status was  assessed using clinical findings on exam and labs or diagnostic tests. The patient's success at meeting treatment goals on disease specific evidence-based guidelines and found to be fair controlled. SELF MANAGEMENT: The patient and I together assessed ways to personally work towards obtaining the recommended goals. RECOMMENDATIONS: avoid decongestants found in common cold remedies, decrease consumption of alcohol, perform routine monitoring of BP with home BP cuff, exercise, reduction of dietary salt, take medicines as prescribed, try not to miss doses and quit smoking.  Regular exercise and maintaining a healthy weight is needed.  Stress reduction may help. A CLINICAL SUMMARY including written plan identify barriers to care unique to individual due to social or financial issues.  We attempt to mutually creat solutions for individual and family understanding. 4. Obesity, diabetes, and hypertension syndrome (HCC) An individual care plan for diabetes was established and reinforced today.  The patient's status was assessed using clinical findings on exam, labs and diagnostic testing. Patient success at meeting goals based on disease specific evidence-based guidelines and found to be fair controlled. Medications were assessed and patient's understanding of the medical issues , including barriers were assessed. Recommend adherence to a diabetic diet, a graduated exercise program, HgbA1c level is checked quarterly, and urine microalbumin performed yearly .  Annual mono-filament sensation testing performed. Lower blood pressure and control hyperlipidemia is important. Get annual eye exams and annual flu shots and smoking cessation discussed.  Self management goals were discussed.         Follow-up: Return in about 2 months (around 02/04/2021) for diabetes.  An After Visit Summary was printed and given to the patient.  Brent Bulla, MD [facility] [phone]

## 2020-12-08 ENCOUNTER — Telehealth (HOSPITAL_COMMUNITY): Payer: Self-pay | Admitting: *Deleted

## 2020-12-08 ENCOUNTER — Ambulatory Visit: Payer: BC Managed Care – PPO | Admitting: Legal Medicine

## 2020-12-08 NOTE — Telephone Encounter (Signed)
Left message on voicemail in reference to upcoming appointment scheduled for 12/11/20. Phone number given for a call back so details instructions can be given.  Clayton Everett Jacqueline   

## 2021-01-08 ENCOUNTER — Ambulatory Visit (INDEPENDENT_AMBULATORY_CARE_PROVIDER_SITE_OTHER): Payer: BC Managed Care – PPO

## 2021-01-08 ENCOUNTER — Other Ambulatory Visit: Payer: Self-pay

## 2021-01-08 DIAGNOSIS — Z23 Encounter for immunization: Secondary | ICD-10-CM

## 2021-01-08 NOTE — Progress Notes (Signed)
   Covid-19 Vaccination Clinic  Name:  Wilbon Obenchain    MRN: [id number] DOB: 1970/09/06  01/08/2021  Mr. Wanek was observed post Covid-19 immunization for 15 minutes without incident. He was provided with Vaccine Information Sheet and instruction to access the V-Safe system.   Mr. Dorko was instructed to call 911 with any severe reactions post vaccine: Difficulty breathing  Swelling of face and throat  A fast heartbeat  A bad rash all over body  Dizziness and weakness   Immunizations Administered     Name Date Dose VIS Date Route   PFIZER Comrnaty(Gray TOP) Covid-19 Vaccine 01/08/2021 10:00 AM 0.3 mL 06/26/2020 Intramuscular   Manufacturer: [employer], [employer]   Lot: [id number]   NDC: [phone]

## 2021-02-04 ENCOUNTER — Ambulatory Visit: Payer: BC Managed Care – PPO | Admitting: Legal Medicine

## 2021-02-13 ENCOUNTER — Other Ambulatory Visit: Payer: Self-pay

## 2021-02-13 ENCOUNTER — Encounter: Payer: Self-pay | Admitting: Legal Medicine

## 2021-02-13 ENCOUNTER — Ambulatory Visit: Payer: BC Managed Care – PPO | Admitting: Legal Medicine

## 2021-02-13 VITALS — BP 160/100 | HR 90 | Resp 16 | Ht 70.0 in | Wt 210.3 lb

## 2021-02-13 DIAGNOSIS — I10 Essential (primary) hypertension: Secondary | ICD-10-CM | POA: Diagnosis not present

## 2021-02-13 DIAGNOSIS — E782 Mixed hyperlipidemia: Secondary | ICD-10-CM

## 2021-02-13 DIAGNOSIS — E1142 Type 2 diabetes mellitus with diabetic polyneuropathy: Secondary | ICD-10-CM | POA: Diagnosis not present

## 2021-02-13 MED ORDER — CHLORTHALIDONE 15 MG PO TABS: 15.0000 mg | ORAL_TABLET | Freq: Every day | ORAL | 1 refills | Status: DC

## 2021-02-13 NOTE — Patient Instructions (Signed)
Managing the Challenge of Quitting Smoking Quitting smoking is a physical and mental challenge. You will face cravings, withdrawal symptoms, and temptation. Before quitting, work with your health care provider to make a plan that can help you manage quitting. Preparation canhelp you quit and keep you from giving in. How to manage lifestyle changes Managing stress Stress can make you want to smoke, and wanting to smoke may cause stress. It is important to find ways to manage your stress. You might try some of the following: Practice relaxation techniques. Breathe slowly and deeply, in through your nose and out through your mouth. Listen to music. Soak in a bath or take a shower. Imagine a peaceful place or vacation. Get some support. Talk with family or friends about your stress. Join a support group. Talk with a counselor or therapist. Get some physical activity. Go for a walk, run, or bike ride. Play a favorite sport. Practice yoga.  Medicines Talk with your health care provider about medicines that might help you dealwith cravings and make quitting easier for you. Relationships Social situations can be difficult when you are quitting smoking. To manage this, you can: Avoid parties and other social situations where people might be smoking. Avoid alcohol. Leave right away if you have the urge to smoke. Explain to your family and friends that you are quitting smoking. Ask for support and let them know you might be a bit grumpy. Plan activities where smoking is not an option. General instructions Be aware that many people gain weight after they quit smoking. However, not everyone does. To keep from gaining weight, have a plan in place before you quit and stick to the plan after you quit. Your plan should include: Having healthy snacks. When you have a craving, it may help to: Eat popcorn, carrots, celery, or other cut vegetables. Chew sugar-free gum. Changing how you eat. Eat small  portion sizes at meals. Eat 4-6 small meals throughout the day instead of 1-2 large meals a day. Be mindful when you eat. Do not watch television or do other things that might distract you as you eat. Exercising regularly. Make time to exercise each day. If you do not have time for a long workout, do short bouts of exercise for 5-10 minutes several times a day. Do some form of strengthening exercise, such as weight lifting. Do some exercise that gets your heart beating and causes you to breathe deeply, such as walking fast, running, swimming, or biking. This is very important. Drinking plenty of water or other low-calorie or no-calorie drinks. Drink 6-8 glasses of water daily.  How to recognize withdrawal symptoms Your body and mind may experience discomfort as you try to get used to not having nicotine in your system. These effects are called withdrawal symptoms. They may include: Feeling hungrier than normal. Having trouble concentrating. Feeling irritable or restless. Having trouble sleeping. Feeling depressed. Craving a cigarette. To manage withdrawal symptoms: Avoid places, people, and activities that trigger your cravings. Remember why you want to quit. Get plenty of sleep. Avoid coffee and other caffeinated drinks. These may worsen some of your symptoms. These symptoms may surprise you. But be assured that they are normal to havewhen quitting smoking. How to manage cravings Come up with a plan for how to deal with your cravings. The plan should include the following: A definition of the specific situation you want to deal with. An alternative action you will take. A clear idea for how this action will help. The   name of someone who might help you with this. Cravings usually last for 5-10 minutes. Consider taking the following actions to help you with your plan to deal with cravings: Keep your mouth busy. Chew sugar-free gum. Suck on hard candies or a straw. Brush your  teeth. Keep your hands and body busy. Change to a different activity right away. Squeeze or play with a ball. Do an activity or a hobby, such as making bead jewelry, practicing needlepoint, or working with wood. Mix up your normal routine. Take a short exercise break. Go for a quick walk or run up and down stairs. Focus on doing something kind or helpful for someone else. Call a friend or family member to talk during a craving. Join a support group. Contact a quitline. Where to find support To get help or find a support group: Call the National Cancer Institute's Smoking Quitline: 1-800-QUIT NOW (784-8669) Visit the website of the Substance Abuse and Mental Health Services Administration: www.samhsa.gov Text QUIT to SmokefreeTXT: 478848 Where to find more information Visit these websites to find more information on quitting smoking: National Cancer Institute: www.smokefree.gov American Lung Association: www.lung.org American Cancer Society: www.cancer.org Centers for Disease Control and Prevention: www.cdc.gov American Heart Association: www.heart.org Contact a health care provider if: You want to change your plan for quitting. The medicines you are taking are not helping. Your eating feels out of control or you cannot sleep. Get help right away if: You feel depressed or become very anxious. Summary Quitting smoking is a physical and mental challenge. You will face cravings, withdrawal symptoms, and temptation to smoke again. Preparation can help you as you go through these challenges. Try different techniques to manage stress, handle social situations, and prevent weight gain. You can deal with cravings by keeping your mouth busy (such as by chewing gum), keeping your hands and body busy, calling family or friends, or contacting a quitline for people who want to quit smoking. You can deal with withdrawal symptoms by avoiding places where people smoke, getting plenty of rest, and  avoiding drinks with caffeine. This information is not intended to replace advice given to you by your health care provider. Make sure you discuss any questions you have with your healthcare provider. Document Revised: 04/24/2019 Document Reviewed: 04/24/2019 Elsevier Patient Education  2022 Elsevier Inc.  

## 2021-02-13 NOTE — Progress Notes (Signed)
Established Patient Office Visit  Subjective:  Patient ID: Clayton Everett, male    DOB: 16-Apr-1971  Age: 50 y.o. MRN: [id number]  CC:  Chief Complaint  Patient presents with   Diabetes    Follow up    HPI Clayton Everett presents for chronic follow up  Patient present with type 2 diabetes.  Specifically, this is type 2, noninsulin requiring diabetes, complicated by hypertension, hypercholesterolemia.  Compliance with treatment has been good; patient take medicines as directed, maintains diet and exercise regimen, follows up as directed, and is keeping glucose diary.  Date of  diagnosis 2022.  Depression screen has been performed.Tobacco screen nonsmoker. Current medicines for diabetes metformin.  Patient is on valsartan for renal protection and pravastatin for cholesterol control.  Patient performs foot exams daily and last ophthalmologic exam was yes.   Past Medical History:  Diagnosis Date   Asthma    Benign essential hypertension 06/30/2020   Diabetes mellitus, type 2 ([place]) 06/30/2020   Mixed hyperlipidemia 06/30/2020    Past Surgical History:  Procedure Laterality Date   none      Family History  Problem Relation Age of Onset   Congestive Heart Failure Mother    Diabetes type II Other    COPD Other    Hypertension Other    Congestive Heart Failure Other    Diabetes Father     Social History   Socioeconomic History   Marital status: Legally Separated    Spouse name: Not on file   Number of children: Not on file   Years of education: Not on file   Highest education level: Not on file  Occupational History   Occupation: Mining engineer  Tobacco Use   Smoking status: Some Days    Packs/day: 1.50    Types: Cigarettes   Smokeless tobacco: Never  Substance and Sexual Activity   Alcohol use: Yes    Alcohol/week: 1.0 standard drink    Types: 1 Cans of beer per week    Comment: weekly    Drug use: Yes    Frequency: 1.0 times per week    Types: Marijuana   Sexual  activity: Yes    Partners: Female  Other Topics Concern   Not on file  Social History Narrative   Not on file   Social Determinants of Health   Financial Resource Strain: Not on file  Food Insecurity: Not on file  Transportation Needs: Not on file  Physical Activity: Not on file  Stress: Not on file  Social Connections: Not on file  Intimate Partner Violence: Not on file    Outpatient Medications Prior to Visit  Medication Sig Dispense Refill   metFORMIN (GLUCOPHAGE) 1000 MG tablet Take 1 tablet (1,000 mg total) by mouth 2 (two) times daily with a meal. 180 tablet 3   pravastatin (PRAVACHOL) 40 MG tablet Take 1 tablet (40 mg total) by mouth daily. 90 tablet 3   sildenafil (VIAGRA) 50 MG tablet Take 1 tablet (50 mg total) by mouth daily as needed for erectile dysfunction. 10 tablet 3   valsartan (DIOVAN) 80 MG tablet Take 1 tablet (80 mg total) by mouth daily. 90 tablet 3   No facility-administered medications prior to visit.    Allergies  Allergen Reactions   Penicillins Shortness Of Breath    ROS Review of Systems  Constitutional:  Negative for activity change and appetite change.  HENT:  Negative for congestion.   Eyes:  Negative for visual disturbance.  Respiratory:  Negative for  chest tightness and shortness of breath.   Cardiovascular:  Negative for chest pain, palpitations and leg swelling.  Gastrointestinal:  Negative for abdominal distention and abdominal pain.  Genitourinary:  Negative for difficulty urinating and dysuria.  Musculoskeletal:  Negative for arthralgias and back pain.  Skin: Negative.   Neurological: Negative.   Psychiatric/Behavioral: Negative.       Objective:    Physical Exam Vitals reviewed.  Constitutional:      General: He is not in acute distress.    Appearance: Normal appearance.  HENT:     Head: Normocephalic.     Right Ear: Tympanic membrane, ear canal and external ear normal.     Left Ear: Tympanic membrane, ear canal and  external ear normal.     Mouth/Throat:     Mouth: Mucous membranes are moist.     Pharynx: Oropharynx is clear.  Eyes:     Extraocular Movements: Extraocular movements intact.     Conjunctiva/sclera: Conjunctivae normal.     Pupils: Pupils are equal, round, and reactive to light.  Cardiovascular:     Rate and Rhythm: Normal rate and regular rhythm.     Pulses: Normal pulses.     Heart sounds: Normal heart sounds. No murmur heard.   No gallop.  Pulmonary:     Effort: Pulmonary effort is normal. No respiratory distress.     Breath sounds: Normal breath sounds. No wheezing.  Abdominal:     General: Abdomen is flat. Bowel sounds are normal. There is no distension.     Palpations: Abdomen is soft.     Tenderness: There is no abdominal tenderness.  Musculoskeletal:        General: Normal range of motion.     Cervical back: Normal range of motion.  Skin:    General: Skin is warm.     Capillary Refill: Capillary refill takes less than 2 seconds.  Neurological:     General: No focal deficit present.     Mental Status: He is alert and oriented to person, place, and time. Mental status is at baseline.  Psychiatric:        Mood and Affect: Mood normal.        Thought Content: Thought content normal.        Judgment: Judgment normal.    BP (!) 160/100   Pulse 90   Resp 16   Ht '5\' 10"'  (1.778 m)   Wt 210 lb 4.8 oz (95.4 kg)   SpO2 96%   BMI 30.17 kg/m  Wt Readings from Last 3 Encounters:  02/13/21 210 lb 4.8 oz (95.4 kg)  12/05/20 206 lb (93.4 kg)  11/21/20 204 lb 3.2 oz (92.6 kg)     Health Maintenance Due  Topic Date Due   PNEUMOCOCCAL POLYSACCHARIDE VACCINE AGE 31-64 HIGH RISK  Never done   Pneumococcal Vaccine 64-54 Years old (1 - PCV) Never done   HIV Screening  Never done   Hepatitis C Screening  Never done   TETANUS/TDAP  Never done   COLONOSCOPY (Pts 45-63yr Insurance coverage will need to be confirmed)  Never done   Zoster Vaccines- Shingrix (1 of 2) Never done     There are no preventive care reminders to display for this patient.  No results found for: TSH Lab Results  Component Value Date   WBC 6.7 02/13/2021   HGB 15.1 02/13/2021   HCT 45.4 02/13/2021   MCV 88 02/13/2021   PLT 260 02/13/2021   Lab Results  Component  Value Date   NA 136 02/13/2021   K 4.8 02/13/2021   CO2 21 02/13/2021   GLUCOSE 216 (H) 02/13/2021   BUN 9 02/13/2021   CREATININE 0.84 02/13/2021   BILITOT 0.2 02/13/2021   ALKPHOS 87 02/13/2021   AST 23 02/13/2021   ALT 32 02/13/2021   PROT 7.2 02/13/2021   ALBUMIN 4.6 02/13/2021   CALCIUM 9.5 02/13/2021   EGFR 106 02/13/2021   Lab Results  Component Value Date   CHOL 163 02/13/2021   Lab Results  Component Value Date   HDL 55 02/13/2021   Lab Results  Component Value Date   LDLCALC 83 02/13/2021   Lab Results  Component Value Date   TRIG 146 02/13/2021   Lab Results  Component Value Date   CHOLHDL 3.0 02/13/2021   Lab Results  Component Value Date   HGBA1C 9.2 (H) 02/13/2021      Assessment & Plan:   Problem List Items Addressed This Visit       Cardiovascular and Mediastinum   Benign essential hypertension   Relevant Medications   chlorthalidone (HYGROTEN) 15 MG tablet   Other Relevant Orders   CBC with Differential/Platelet (Completed)   Comprehensive metabolic panel (Completed) An individual hypertension care plan was established and reinforced today.  The patient's status was assessed using clinical findings on exam and labs or diagnostic tests. The patient's success at meeting treatment goals on disease specific evidence-based guidelines and found to be well controlled. SELF MANAGEMENT: The patient and I together assessed ways to personally work towards obtaining the recommended goals. RECOMMENDATIONS: avoid decongestants found in common cold remedies, decrease consumption of alcohol, perform routine monitoring of BP with home BP cuff, exercise, reduction of dietary salt, take  medicines as prescribed, try not to miss doses and quit smoking.  Regular exercise and maintaining a healthy weight is needed.  Stress reduction may help. A CLINICAL SUMMARY including written plan identify barriers to care unique to individual due to social or financial issues.  We attempt to mutually creat solutions for individual and family understanding.      Other   Mixed hyperlipidemia   Relevant Medications   chlorthalidone (HYGROTEN) 15 MG tablet   Other Relevant Orders   Lipid panel (Completed) AN INDIVIDUAL CARE PLAN for hyperlipidemia/ cholesterol was established and reinforced today.  The patient's status was assessed using clinical findings on exam, lab and other diagnostic tests. The patient's disease status was assessed based on evidence-based guidelines and found to be fair controlled. MEDICATIONS were reviewed. SELF MANAGEMENT GOALS have been discussed and patient's success at attaining the goal of low cholesterol was assessed. RECOMMENDATION given include regular exercise 3 days a week and low cholesterol/low fat diet. CLINICAL SUMMARY including written plan to identify barriers unique to the patient due to social or economic  reasons was discussed.    Other Visit Diagnoses     Type 2 diabetes mellitus with diabetic polyneuropathy, without long-term current use of insulin ([place])    -  Primary   Relevant Orders   Hemoglobin A1c (Completed) An individual care plan for diabetes was established and reinforced today.  The patient's status was assessed using clinical findings on exam, labs and diagnostic testing. Patient success at meeting goals based on disease specific evidence-based guidelines and found to be good controlled. Medications were assessed and patient's understanding of the medical issues , including barriers were assessed. Recommend adherence to a diabetic diet, a graduated exercise program, HgbA1c level is checked quarterly, and  urine microalbumin performed yearly .   Annual mono-filament sensation testing performed. Lower blood pressure and control hyperlipidemia is important. Get annual eye exams and annual flu shots and smoking cessation discussed.  Self management goals were discussed.        Meds ordered this encounter  Medications   chlorthalidone (HYGROTEN) 15 MG tablet    Sig: Take 1 tablet (15 mg total) by mouth daily.    Dispense:  90 tablet    Refill:  1    Follow-up: Return in about 4 months (around 06/16/2021), or nurse check bp on month, for fasting.    Reinaldo Meeker, MD

## 2021-02-14 LAB — COMPREHENSIVE METABOLIC PANEL
ALT: 32 IU/L (ref 0–44)
AST: 23 IU/L (ref 0–40)
Albumin/Globulin Ratio: 1.8 (ref 1.2–2.2)
Albumin: 4.6 g/dL (ref 3.8–4.9)
Alkaline Phosphatase: 87 IU/L (ref 44–121)
BUN/Creatinine Ratio: 11 (ref 9–20)
BUN: 9 mg/dL (ref 6–24)
Bilirubin Total: 0.2 mg/dL (ref 0.0–1.2)
CO2: 21 mmol/L (ref 20–29)
Calcium: 9.5 mg/dL (ref 8.7–10.2)
Chloride: 97 mmol/L (ref 96–106)
Creatinine, Ser: 0.84 mg/dL (ref 0.76–1.27)
Globulin, Total: 2.6 g/dL (ref 1.5–4.5)
Glucose: 216 mg/dL — ABNORMAL HIGH (ref 65–99)
Potassium: 4.8 mmol/L (ref 3.5–5.2)
Sodium: 136 mmol/L (ref 134–144)
Total Protein: 7.2 g/dL (ref 6.0–8.5)
eGFR: 106 mL/min/{1.73_m2} (ref >59)

## 2021-02-14 LAB — CBC WITH DIFFERENTIAL/PLATELET
Basophils Absolute: 0.1 10*3/uL (ref 0.0–0.2)
Basos: 1 %
EOS (ABSOLUTE): 0.2 10*3/uL (ref 0.0–0.4)
Eos: 3 %
Hematocrit: 45.4 % (ref 37.5–51.0)
Hemoglobin: 15.1 g/dL (ref 13.0–17.7)
Immature Grans (Abs): 0 10*3/uL (ref 0.0–0.1)
Immature Granulocytes: 0 %
Lymphocytes Absolute: 2.6 10*3/uL (ref 0.7–3.1)
Lymphs: 39 %
MCH: 29.2 pg (ref 26.6–33.0)
MCHC: 33.3 g/dL (ref 31.5–35.7)
MCV: 88 fL (ref 79–97)
Monocytes Absolute: 0.6 10*3/uL (ref 0.1–0.9)
Monocytes: 9 %
Neutrophils Absolute: 3.2 10*3/uL (ref 1.4–7.0)
Neutrophils: 48 %
Platelets: 260 10*3/uL (ref 150–450)
RBC: 5.18 x10E6/uL (ref 4.14–5.80)
RDW: 12.4 % (ref 11.6–15.4)
WBC: 6.7 10*3/uL (ref 3.4–10.8)

## 2021-02-14 LAB — LIPID PANEL
Chol/HDL Ratio: 3 ratio (ref 0.0–5.0)
Cholesterol, Total: 163 mg/dL (ref 100–199)
HDL: 55 mg/dL (ref >39)
LDL Chol Calc (NIH): 83 mg/dL (ref 0–99)
Triglycerides: 146 mg/dL (ref 0–149)
VLDL Cholesterol Cal: 25 mg/dL (ref 5–40)

## 2021-02-14 LAB — HEMOGLOBIN A1C
Est. average glucose Bld gHb Est-mCnc: 217 mg/dL
Hgb A1c MFr Bld: 9.2 % — ABNORMAL HIGH (ref 4.8–5.6)

## 2021-02-14 LAB — CARDIOVASCULAR RISK ASSESSMENT

## 2021-02-15 NOTE — Progress Notes (Signed)
Cbc normal, glucose 216, kidney tests normal, liver tests normal, A1c 9.2 still high, recommendGPL1 once a week of SGL2 lp

## 2021-03-20 ENCOUNTER — Ambulatory Visit: Payer: BC Managed Care – PPO | Admitting: Cardiology

## 2021-03-20 ENCOUNTER — Ambulatory Visit: Payer: BC Managed Care – PPO

## 2021-06-19 ENCOUNTER — Ambulatory Visit (INDEPENDENT_AMBULATORY_CARE_PROVIDER_SITE_OTHER): Payer: BC Managed Care – PPO | Admitting: Legal Medicine

## 2021-06-19 DIAGNOSIS — I152 Hypertension secondary to endocrine disorders: Secondary | ICD-10-CM

## 2021-06-19 DIAGNOSIS — E1159 Type 2 diabetes mellitus with other circulatory complications: Secondary | ICD-10-CM

## 2021-06-19 DIAGNOSIS — E669 Obesity, unspecified: Secondary | ICD-10-CM

## 2021-06-19 DIAGNOSIS — E1169 Type 2 diabetes mellitus with other specified complication: Secondary | ICD-10-CM

## 2021-06-19 DIAGNOSIS — I10 Essential (primary) hypertension: Secondary | ICD-10-CM

## 2021-06-19 NOTE — Progress Notes (Signed)
Subjective:  Patient ID: Clayton Everett, male    DOB: [birth year]  Age: 50 y.o. MRN: [id number]  No chief complaint on file.   HPI   Current Outpatient Medications on File Prior to Visit  Medication Sig Dispense Refill   chlorthalidone (HYGROTEN) 15 MG tablet Take 1 tablet (15 mg total) by mouth daily. 90 tablet 1   metFORMIN (GLUCOPHAGE) 1000 MG tablet Take 1 tablet (1,000 mg total) by mouth 2 (two) times daily with a meal. 180 tablet 3   pravastatin (PRAVACHOL) 40 MG tablet Take 1 tablet (40 mg total) by mouth daily. 90 tablet 3   sildenafil (VIAGRA) 50 MG tablet Take 1 tablet (50 mg total) by mouth daily as needed for erectile dysfunction. 10 tablet 3   valsartan (DIOVAN) 80 MG tablet Take 1 tablet (80 mg total) by mouth daily. 90 tablet 3   No current facility-administered medications on file prior to visit.   Past Medical History:  Diagnosis Date   Asthma    Benign essential hypertension 06/30/2020   Chest pain 11/07/2020   Cocaine abuse in remission ([facility]) 08/07/2020   Diabetes mellitus, type 2 ([facility]) 06/30/2020   Marijuana abuse, continuous 08/07/2020   Mixed hyperlipidemia 06/30/2020   Obesity, diabetes, and hypertension syndrome ([facility]) 09/08/2020   Past Surgical History:  Procedure Laterality Date   none      Family History  Problem Relation Age of Onset   Congestive Heart Failure Mother    Diabetes type II Other    COPD Other    Hypertension Other    Congestive Heart Failure Other    Diabetes Father    Social History   Socioeconomic History   Marital status: Legally Separated    Spouse name: Not on file   Number of children: Not on file   Years of education: Not on file   Highest education level: Not on file  Occupational History   Occupation: Designer, television/film set  Tobacco Use   Smoking status: Some Days    Packs/day: 1.50    Types: Cigarettes   Smokeless tobacco: Never  Substance and Sexual Activity   Alcohol use: Yes    Alcohol/week: 1.0 standard drink    Types:  1 Cans of beer per week    Comment: weekly    Drug use: Yes    Frequency: 1.0 times per week    Types: Marijuana   Sexual activity: Yes    Partners: Female  Other Topics Concern   Not on file  Social History Narrative   Not on file   Social Determinants of Health   Financial Resource Strain: Not on file  Food Insecurity: Not on file  Transportation Needs: Not on file  Physical Activity: Not on file  Stress: Not on file  Social Connections: Not on file    Review of Systems   Objective:  There were no vitals taken for this visit.  BP/Weight 02/13/2021 12/05/2020 11/21/2020  Systolic BP 160 130 140  Diastolic BP 100 85 100  Wt. (Lbs) 210.3 206 204.2  BMI 30.17 29.56 29.3    Physical Exam  Diabetic Foot Exam - Simple   No data filed      Lab Results  Component Value Date   WBC 6.7 02/13/2021   HGB 15.1 02/13/2021   HCT 45.4 02/13/2021   PLT 260 02/13/2021   GLUCOSE 216 (H) 02/13/2021   CHOL 163 02/13/2021   TRIG 146 02/13/2021   HDL 55 02/13/2021   LDLCALC 83 02/13/2021  ALT 32 02/13/2021   AST 23 02/13/2021   NA 136 02/13/2021   K 4.8 02/13/2021   CL 97 02/13/2021   CREATININE 0.84 02/13/2021   BUN 9 02/13/2021   CO2 21 02/13/2021   HGBA1C 9.2 (H) 02/13/2021      Assessment & Plan:   Problem List Items Addressed This Visit   None .  No orders of the defined types were placed in this encounter.   No orders of the defined types were placed in this encounter.    Follow-up: No follow-ups on file.  An After Visit Summary was printed and given to the patient.  Brent Bulla, MD [facility] [phone]

## 2021-07-02 NOTE — Progress Notes (Signed)
Subjective:  Patient ID: Clayton Everett, male    DOB: [birth year]  Age: 50 y.o. MRN: [id number]  Chief Complaint  Patient presents with   Diabetes   Hyperlipidemia   Hypertension    QQV:ZDGLOVF visit  Patient present with type 2 diabetes.  Specifically, this is type 2, noninauin requiring diabetes, complicated by hypertension and hypercholesterolemia.  Compliance with treatment has been good; patient take medicines as directed, maintains diet and exercise regimen, follows up as directed, and is keeping glucose diary.  Date of  diagnosis 2020.  Depression screen has been performed.Tobacco screen smoker. Current medicines for diabetes metformin.  Patient is on valsartan for renal protection and pravastatin for cholesterol control.  Patient performs foot exams daily and last ophthalmologic exam was got eyes checked.   Patient presents for follow up of hypertension.  Patient tolerating valsartan well with side effects.  Patient was diagnosed with hypertension 2020 so has been treated for hypertension for 2 years.Patient is working on maintaining diet and exercise regimen and follows up as directed. Complication include none.   Patient presents with hyperlipidemia.  Compliance with treatment has been good; patient takes medicines as directed, maintains low cholesterol diet, follows up as directed, and maintains exercise regimen.  Patient is using pravastatin without problems.   Still smoking: start nicotine patches   Current Outpatient Medications on File Prior to Visit  Medication Sig Dispense Refill   sildenafil (VIAGRA) 50 MG tablet Take 1 tablet (50 mg total) by mouth daily as needed for erectile dysfunction. 10 tablet 3   No current facility-administered medications on file prior to visit.   Past Medical History:  Diagnosis Date   Asthma    Benign essential hypertension 06/30/2020   Chest pain 11/07/2020   Cocaine abuse in remission (HCC) 08/07/2020   Diabetes mellitus, type 2 (HCC)  06/30/2020   Marijuana abuse, continuous 08/07/2020   Mixed hyperlipidemia 06/30/2020   Obesity, diabetes, and hypertension syndrome (HCC) 09/08/2020   Past Surgical History:  Procedure Laterality Date   none      Family History  Problem Relation Age of Onset   Congestive Heart Failure Mother    Diabetes type II Other    COPD Other    Hypertension Other    Congestive Heart Failure Other    Diabetes Father    Social History   Socioeconomic History   Marital status: Legally Separated    Spouse name: Not on file   Number of children: Not on file   Years of education: Not on file   Highest education level: Not on file  Occupational History   Occupation: Designer, television/film set  Tobacco Use   Smoking status: Some Days    Packs/day: 1.50    Types: Cigarettes   Smokeless tobacco: Never  Substance and Sexual Activity   Alcohol use: Yes    Alcohol/week: 1.0 standard drink    Types: 1 Cans of beer per week    Comment: weekly    Drug use: Yes    Frequency: 1.0 times per week    Types: Marijuana   Sexual activity: Yes    Partners: Female  Other Topics Concern   Not on file  Social History Narrative   Not on file   Social Determinants of Health   Financial Resource Strain: Not on file  Food Insecurity: Not on file  Transportation Needs: Not on file  Physical Activity: Not on file  Stress: Not on file  Social Connections: Not on file    Review  of Systems  Constitutional:  Negative for chills, fatigue, fever and unexpected weight change.  HENT:  Negative for congestion, ear pain, sinus pain and sore throat.   Respiratory:  Negative for cough and shortness of breath.   Cardiovascular:  Negative for chest pain and palpitations.  Gastrointestinal:  Negative for abdominal pain, blood in stool, constipation, diarrhea, nausea and vomiting.  Endocrine: Negative for polydipsia.  Genitourinary:  Negative for dysuria.  Musculoskeletal:  Negative for back pain.  Skin:  Negative for rash.   Neurological:  Negative for headaches.  Psychiatric/Behavioral: Negative.      Objective:  BP 140/88    Pulse (!) 105    Temp (!) 97.2 F (36.2 C)    Resp 16    Ht 5\' 10"  (1.778 m)    Wt 207 lb (93.9 kg)    SpO2 98%    BMI 29.70 kg/m   BP/Weight 07/03/2021 02/13/2021 12/05/2020  Systolic BP 140 160 130  Diastolic BP 88 100 85  Wt. (Lbs) 207 210.3 206  BMI 29.7 30.17 29.56    Physical Exam Vitals reviewed.  Constitutional:      General: He is not in acute distress.    Appearance: Normal appearance.  HENT:     Head: Normocephalic and atraumatic.     Right Ear: Tympanic membrane normal.     Left Ear: Tympanic membrane normal.     Nose: Nose normal.     Mouth/Throat:     Mouth: Mucous membranes are moist.     Pharynx: Oropharynx is clear.  Eyes:     Extraocular Movements: Extraocular movements intact.     Conjunctiva/sclera: Conjunctivae normal.     Pupils: Pupils are equal, round, and reactive to light.  Cardiovascular:     Rate and Rhythm: Normal rate and regular rhythm.     Pulses: Normal pulses.     Heart sounds: Normal heart sounds. No murmur heard.   No gallop.  Pulmonary:     Effort: Pulmonary effort is normal. No respiratory distress.     Breath sounds: Normal breath sounds. No wheezing.  Abdominal:     General: Abdomen is flat. Bowel sounds are normal. There is no distension.     Palpations: Abdomen is soft.     Tenderness: There is no abdominal tenderness.  Musculoskeletal:        General: Normal range of motion.     Cervical back: Normal range of motion and neck supple.     Right lower leg: No edema.     Left lower leg: No edema.  Skin:    General: Skin is warm.     Capillary Refill: Capillary refill takes less than 2 seconds.  Neurological:     General: No focal deficit present.     Mental Status: He is alert and oriented to person, place, and time. Mental status is at baseline.  Psychiatric:        Mood and Affect: Mood normal.        Behavior:  Behavior normal.        Thought Content: Thought content normal.        Judgment: Judgment normal.    Diabetic Foot Exam - Simple   Simple Foot Form Diabetic Foot exam was performed with the following findings: Yes 07/03/2021 10:21 AM  Visual Inspection No deformities, no ulcerations, no other skin breakdown bilaterally: Yes Sensation Testing Intact to touch and monofilament testing bilaterally: Yes Pulse Check Posterior Tibialis and Dorsalis pulse intact bilaterally: Yes  Comments      Lab Results  Component Value Date   WBC 6.9 07/03/2021   HGB 14.5 07/03/2021   HCT 45.1 07/03/2021   PLT 228 07/03/2021   GLUCOSE 198 (H) 07/03/2021   CHOL 120 07/03/2021   TRIG 192 (H) 07/03/2021   HDL 42 07/03/2021   LDLCALC 47 07/03/2021   ALT 23 07/03/2021   AST 21 07/03/2021   NA 133 (L) 07/03/2021   K 4.5 07/03/2021   CL 97 07/03/2021   CREATININE 0.98 07/03/2021   BUN 13 07/03/2021   CO2 21 07/03/2021   HGBA1C 8.9 (H) 07/03/2021      Assessment & Plan:   Problem List Items Addressed This Visit       Cardiovascular and Mediastinum   Benign essential hypertension - Primary   Relevant Medications   valsartan (DIOVAN) 80 MG tablet   pravastatin (PRAVACHOL) 40 MG tablet   chlorthalidone (HYGROTEN) 15 MG tablet   Other Relevant Orders   Comprehensive metabolic panel (Completed)   CBC with Differential/Platelet (Completed) An individual hypertension care plan was established and reinforced today.  The patient's status was assessed using clinical findings on exam and labs or diagnostic tests. The patient's success at meeting treatment goals on disease specific evidence-based guidelines and found to be well controlled. SELF MANAGEMENT: The patient and I together assessed ways to personally work towards obtaining the recommended goals. RECOMMENDATIONS: avoid decongestants found in common cold remedies, decrease consumption of alcohol, perform routine monitoring of BP with home BP  cuff, exercise, reduction of dietary salt, take medicines as prescribed, try not to miss doses and quit smoking.  Regular exercise and maintaining a healthy weight is needed.  Stress reduction may help. A CLINICAL SUMMARY including written plan identify barriers to care unique to individual due to social or financial issues.  We attempt to mutually creat solutions for individual and family understanding.     Obesity, diabetes, and hypertension syndrome (HCC)   Relevant Medications   valsartan (DIOVAN) 80 MG tablet   pravastatin (PRAVACHOL) 40 MG tablet   metFORMIN (GLUCOPHAGE) 1000 MG tablet   chlorthalidone (HYGROTEN) 15 MG tablet   Other Relevant Orders   Hemoglobin A1c (Completed) An individual care plan for diabetes was established and reinforced today.  The patient's status was assessed using clinical findings on exam, labs and diagnostic testing. Patient success at meeting goals based on disease specific evidence-based guidelines and found to be fair controlled. Medications were assessed and patient's understanding of the medical issues , including barriers were assessed. Recommend adherence to a diabetic diet, a graduated exercise program, HgbA1c level is checked quarterly, and urine microalbumin performed yearly .  Annual mono-filament sensation testing performed. Lower blood pressure and control hyperlipidemia is important. Get annual eye exams and annual flu shots and smoking cessation discussed.  Self management goals were discussed.      Other   Mixed hyperlipidemia   Relevant Medications   valsartan (DIOVAN) 80 MG tablet   pravastatin (PRAVACHOL) 40 MG tablet   chlorthalidone (HYGROTEN) 15 MG tablet   Other Relevant Orders   Lipid panel (Completed) AN INDIVIDUAL CARE PLAN for hyperlipidemia/ cholesterol was established and reinforced today.  The patient's status was assessed using clinical findings on exam, lab and other diagnostic tests. The patient's disease status was assessed  based on evidence-based guidelines and found to be well controlled. MEDICATIONS were reviewed. SELF MANAGEMENT GOALS have been discussed and patient's success at attaining the goal of low cholesterol was assessed.  RECOMMENDATION given include regular exercise 3 days a week and low cholesterol/low fat diet. CLINICAL SUMMARY including written plan to identify barriers unique to the patient due to social or economic  reasons was discussed.    Other Visit Diagnoses     Type 2 diabetes mellitus without complication, without long-term current use of insulin (HCC)       Relevant Medications   valsartan (DIOVAN) 80 MG tablet   pravastatin (PRAVACHOL) 40 MG tablet   metFORMIN (GLUCOPHAGE) 1000 MG tablet An individual care plan for diabetes was established and reinforced today.  The patient's status was assessed using clinical findings on exam, labs and diagnostic testing. Patient success at meeting goals based on disease specific evidence-based guidelines and found to be fair controlled. Medications were assessed and patient's understanding of the medical issues , including barriers were assessed. Recommend adherence to a diabetic diet, a graduated exercise program, HgbA1c level is checked quarterly, and urine microalbumin performed yearly .  Annual mono-filament sensation testing performed. Lower blood pressure and control hyperlipidemia is important. Get annual eye exams and annual flu shots and smoking cessation discussed.  Self management goals were discussed.      .  Meds ordered this encounter  Medications   valsartan (DIOVAN) 80 MG tablet    Sig: Take 1 tablet (80 mg total) by mouth daily.    Dispense:  90 tablet    Refill:  3   pravastatin (PRAVACHOL) 40 MG tablet    Sig: Take 1 tablet (40 mg total) by mouth daily.    Dispense:  90 tablet    Refill:  3   metFORMIN (GLUCOPHAGE) 1000 MG tablet    Sig: Take 1 tablet (1,000 mg total) by mouth 2 (two) times daily with a meal.    Dispense:   180 tablet    Refill:  3   chlorthalidone (HYGROTEN) 15 MG tablet    Sig: Take 1 tablet (15 mg total) by mouth daily.    Dispense:  90 tablet    Refill:  1    Orders Placed This Encounter  Procedures   Comprehensive metabolic panel   Hemoglobin A1c   Lipid panel   CBC with Differential/Platelet   Cardiovascular Risk Assessment     Follow-up: Return in about 4 months (around 11/01/2021) for fasting.  An After Visit Summary was printed and given to the patient.  Brent Bulla, MD [facility] [phone]

## 2021-07-03 ENCOUNTER — Other Ambulatory Visit: Payer: Self-pay

## 2021-07-03 ENCOUNTER — Encounter: Payer: Self-pay | Admitting: Legal Medicine

## 2021-07-03 ENCOUNTER — Ambulatory Visit: Payer: BC Managed Care – PPO | Admitting: Legal Medicine

## 2021-07-03 VITALS — BP 140/88 | HR 105 | Temp 97.2°F | Resp 16 | Ht 70.0 in | Wt 207.0 lb

## 2021-07-03 DIAGNOSIS — E669 Obesity, unspecified: Secondary | ICD-10-CM | POA: Diagnosis not present

## 2021-07-03 DIAGNOSIS — E119 Type 2 diabetes mellitus without complications: Secondary | ICD-10-CM | POA: Diagnosis not present

## 2021-07-03 DIAGNOSIS — E782 Mixed hyperlipidemia: Secondary | ICD-10-CM | POA: Diagnosis not present

## 2021-07-03 DIAGNOSIS — I152 Hypertension secondary to endocrine disorders: Secondary | ICD-10-CM

## 2021-07-03 DIAGNOSIS — E1159 Type 2 diabetes mellitus with other circulatory complications: Secondary | ICD-10-CM

## 2021-07-03 DIAGNOSIS — I10 Essential (primary) hypertension: Secondary | ICD-10-CM | POA: Diagnosis not present

## 2021-07-03 DIAGNOSIS — E1169 Type 2 diabetes mellitus with other specified complication: Secondary | ICD-10-CM | POA: Diagnosis not present

## 2021-07-03 MED ORDER — CHLORTHALIDONE 15 MG PO TABS: 15.0000 mg | ORAL_TABLET | Freq: Every day | ORAL | 1 refills | Status: DC

## 2021-07-03 MED ORDER — VALSARTAN 80 MG PO TABS: 80.0000 mg | ORAL_TABLET | Freq: Every day | ORAL | 3 refills | Status: DC

## 2021-07-03 MED ORDER — PRAVASTATIN SODIUM 40 MG PO TABS: 40.0000 mg | ORAL_TABLET | Freq: Every day | ORAL | 3 refills | Status: DC

## 2021-07-03 MED ORDER — METFORMIN HCL 1000 MG PO TABS: 1000.0000 mg | ORAL_TABLET | Freq: Two times a day (BID) | ORAL | 3 refills | Status: DC

## 2021-07-04 LAB — CBC WITH DIFFERENTIAL/PLATELET
Basophils Absolute: 0 10*3/uL (ref 0.0–0.2)
Basos: 1 %
EOS (ABSOLUTE): 0.2 10*3/uL (ref 0.0–0.4)
Eos: 3 %
Hematocrit: 45.1 % (ref 37.5–51.0)
Hemoglobin: 14.5 g/dL (ref 13.0–17.7)
Immature Grans (Abs): 0 10*3/uL (ref 0.0–0.1)
Immature Granulocytes: 0 %
Lymphocytes Absolute: 2.5 10*3/uL (ref 0.7–3.1)
Lymphs: 36 %
MCH: 28.8 pg (ref 26.6–33.0)
MCHC: 32.2 g/dL (ref 31.5–35.7)
MCV: 90 fL (ref 79–97)
Monocytes Absolute: 0.6 10*3/uL (ref 0.1–0.9)
Monocytes: 9 %
Neutrophils Absolute: 3.5 10*3/uL (ref 1.4–7.0)
Neutrophils: 51 %
Platelets: 228 10*3/uL (ref 150–450)
RBC: 5.03 x10E6/uL (ref 4.14–5.80)
RDW: 12.1 % (ref 11.6–15.4)
WBC: 6.9 10*3/uL (ref 3.4–10.8)

## 2021-07-04 LAB — COMPREHENSIVE METABOLIC PANEL
ALT: 23 IU/L (ref 0–44)
AST: 21 IU/L (ref 0–40)
Albumin/Globulin Ratio: 1.8 (ref 1.2–2.2)
Albumin: 4.4 g/dL (ref 3.8–4.9)
Alkaline Phosphatase: 84 IU/L (ref 44–121)
BUN/Creatinine Ratio: 13 (ref 9–20)
BUN: 13 mg/dL (ref 6–24)
Bilirubin Total: 0.3 mg/dL (ref 0.0–1.2)
CO2: 21 mmol/L (ref 20–29)
Calcium: 9.5 mg/dL (ref 8.7–10.2)
Chloride: 97 mmol/L (ref 96–106)
Creatinine, Ser: 0.98 mg/dL (ref 0.76–1.27)
Globulin, Total: 2.4 g/dL (ref 1.5–4.5)
Glucose: 198 mg/dL — ABNORMAL HIGH (ref 70–99)
Potassium: 4.5 mmol/L (ref 3.5–5.2)
Sodium: 133 mmol/L — ABNORMAL LOW (ref 134–144)
Total Protein: 6.8 g/dL (ref 6.0–8.5)
eGFR: 93 mL/min/{1.73_m2} (ref >59)

## 2021-07-04 LAB — LIPID PANEL
Chol/HDL Ratio: 2.9 ratio (ref 0.0–5.0)
Cholesterol, Total: 120 mg/dL (ref 100–199)
HDL: 42 mg/dL (ref >39)
LDL Chol Calc (NIH): 47 mg/dL (ref 0–99)
Triglycerides: 192 mg/dL — ABNORMAL HIGH (ref 0–149)
VLDL Cholesterol Cal: 31 mg/dL (ref 5–40)

## 2021-07-04 LAB — HEMOGLOBIN A1C
Est. average glucose Bld gHb Est-mCnc: 209 mg/dL
Hgb A1c MFr Bld: 8.9 % — ABNORMAL HIGH (ref 4.8–5.6)

## 2021-07-04 LAB — CARDIOVASCULAR RISK ASSESSMENT

## 2021-07-05 NOTE — Progress Notes (Signed)
Glucose 198, kidney tests normal, liver tests normal, A1c 8.9, triglycerides 192 high, CBC normal. Diabetes very poor control.  Recommend glp1 or glp/gip lp

## 2021-07-08 ENCOUNTER — Other Ambulatory Visit: Payer: Self-pay | Admitting: Legal Medicine

## 2021-07-08 DIAGNOSIS — I10 Essential (primary) hypertension: Secondary | ICD-10-CM

## 2021-07-08 MED ORDER — CHLORTHALIDONE 25 MG PO TABS: 25.0000 mg | ORAL_TABLET | Freq: Every day | ORAL | 2 refills | Status: DC

## 2021-07-09 ENCOUNTER — Other Ambulatory Visit: Payer: Self-pay | Admitting: Legal Medicine

## 2021-07-09 ENCOUNTER — Other Ambulatory Visit: Payer: Self-pay

## 2021-07-09 DIAGNOSIS — E669 Obesity, unspecified: Secondary | ICD-10-CM

## 2021-07-09 DIAGNOSIS — F17210 Nicotine dependence, cigarettes, uncomplicated: Secondary | ICD-10-CM

## 2021-07-09 MED ORDER — NICOTINE 21 MG/24HR TD PT24: 21.0000 mg | MEDICATED_PATCH | Freq: Every day | TRANSDERMAL | 0 refills | Status: DC

## 2021-07-09 MED ORDER — TIRZEPATIDE 5 MG/0.5ML ~~LOC~~ SOAJ: 5.0000 mg | SUBCUTANEOUS | 4 refills | Status: DC

## 2021-11-10 ENCOUNTER — Ambulatory Visit: Payer: BC Managed Care – PPO | Admitting: Legal Medicine

## 2022-01-14 NOTE — Progress Notes (Deleted)
Subjective:  Patient ID: Clayton Everett, male    DOB: 12-04-1970  Age: 51 y.o. MRN: [id number]  Chief Complaint  Patient presents with   Hypertension   Hyperlipidemia   Diabetes    HPI   Patient present with type 2 diabetes.  Specifically, this is type 2, noninauin requiring diabetes, complicated by hypertension and hypercholesterolemia.  Compliance with treatment has been good; patient take medicines as directed, maintains diet and exercise regimen, follows up as directed, and is keeping glucose diary.  Date of  diagnosis 2020.  Depression screen has been performed.Tobacco screen smoker. Current medicines for diabetes metformin.  Patient is on valsartan for renal protection and pravastatin for cholesterol control.  Patient performs foot exams daily and last ophthalmologic exam was got eyes checked.    Patient presents for follow up of hypertension.  Patient tolerating valsartan well with side effects.  Patient was diagnosed with hypertension 2020 so has been treated for hypertension for 2 years.Patient is working on maintaining diet and exercise regimen and follows up as directed. Complication include none.    Patient presents with hyperlipidemia.  Compliance with treatment has been good; patient takes medicines as directed, maintains low cholesterol diet, follows up as directed, and maintains exercise regimen.  Patient is using pravastatin without problems.   Diabetes Mellitus Type II, follow-up  Lab Results  Component Value Date   HGBA1C 8.9 (H) 07/03/2021   HGBA1C 9.2 (H) 02/13/2021   HGBA1C 9.9 (H) 11/07/2020   Last seen for diabetes 6 months ago.  Management since then includes mounjaro and metformin. He reports excellent compliance with treatment. He is not having side effects.   Home blood sugar records: {diabetes glucometry results:16657}  Most Recent Eye Exam:  ***  --------------------------------------------------------------------------------------------------- Hypertension, follow-up  BP Readings from Last 3 Encounters:  07/03/21 140/88  02/13/21 (!) 160/100  12/05/20 130/85   Wt Readings from Last 3 Encounters:  07/03/21 207 lb (93.9 kg)  02/13/21 210 lb 4.8 oz (95.4 kg)  12/05/20 206 lb (93.4 kg)     BP at that visit was 140/88. Management since that visit includes chlorthalidone and valsartan. He reports excellent compliance with treatment. He is not having side effects.  He {is/is not:9024} exercising. He {is/is not:9024} adherent to low salt diet.     He {does/does not:200015} smoke.  Use of agents associated with hypertension: {bp agents assoc with hypertension:511::"none"}.   --------------------------------------------------------------------------------------------------- Lipid/Cholesterol, follow-up  Last Lipid Panel: Lab Results  Component Value Date   CHOL 120 07/03/2021   LDLCALC 47 07/03/2021   HDL 42 07/03/2021   TRIG 192 (H) 07/03/2021    Management since that visit includes pravastatin.  He reports excellent compliance with treatment. He is not having side effects.   He is following a {diet:21022986} diet. Current exercise: {exercise types:16438}  Last metabolic panel Lab Results  Component Value Date   GLUCOSE 198 (H) 07/03/2021   NA 133 (L) 07/03/2021   K 4.5 07/03/2021   BUN 13 07/03/2021   CREATININE 0.98 07/03/2021   GFRNONAA 86 08/07/2020   GFRAA 100 08/07/2020   CALCIUM 9.5 07/03/2021   AST 21 07/03/2021   ALT 23 07/03/2021   The ASCVD Risk score (Arnett DK, et al., 2019) failed to calculate for the following reasons:   The valid total cholesterol range is 130 to 320 mg/dL  ---------------------------------------------------------------------------------------------------  Current Outpatient Medications on File Prior to Visit  Medication Sig Dispense Refill   chlorthalidone  (HYGROTON) 25 MG tablet Take 1 tablet (25  mg total) by mouth daily. Not available as 15mg  any more 90 tablet 2   metFORMIN (GLUCOPHAGE) 1000 MG tablet Take 1 tablet (1,000 mg total) by mouth 2 (two) times daily with a meal. 180 tablet 3   nicotine (NICODERM CQ) 21 mg/24hr patch Place 1 patch (21 mg total) onto the skin daily. 28 patch 0   pravastatin (PRAVACHOL) 40 MG tablet Take 1 tablet (40 mg total) by mouth daily. 90 tablet 3   sildenafil (VIAGRA) 50 MG tablet Take 1 tablet (50 mg total) by mouth daily as needed for erectile dysfunction. 10 tablet 3   tirzepatide (MOUNJARO) 5 MG/0.5ML Pen Inject 5 mg into the skin once a week. 6 mL 4   valsartan (DIOVAN) 80 MG tablet Take 1 tablet (80 mg total) by mouth daily. 90 tablet 3   No current facility-administered medications on file prior to visit.   Past Medical History:  Diagnosis Date   Asthma    Benign essential hypertension 06/30/2020   Chest pain 11/07/2020   Cocaine abuse in remission ([place]) 08/07/2020   Diabetes mellitus, type 2 ([place]) 06/30/2020   Marijuana abuse, continuous 08/07/2020   Mixed hyperlipidemia 06/30/2020   Obesity, diabetes, and hypertension syndrome ([place]) 09/08/2020   Past Surgical History:  Procedure Laterality Date   none      Family History  Problem Relation Age of Onset   Congestive Heart Failure Mother    Diabetes type II Other    COPD Other    Hypertension Other    Congestive Heart Failure Other    Diabetes Father    Social History   Socioeconomic History   Marital status: Legally Separated    Spouse name: Not on file   Number of children: Not on file   Years of education: Not on file   Highest education level: Not on file  Occupational History   Occupation: Mining engineer  Tobacco Use   Smoking status: Some Days    Packs/day: 1.50    Types: Cigarettes   Smokeless tobacco: Never  Substance and Sexual Activity   Alcohol use: Yes    Alcohol/week: 1.0 standard drink of alcohol    Types: 1 Cans of beer  per week    Comment: weekly    Drug use: Yes    Frequency: 1.0 times per week    Types: Marijuana   Sexual activity: Yes    Partners: Female  Other Topics Concern   Not on file  Social History Narrative   Not on file   Social Determinants of Health   Financial Resource Strain: Not on file  Food Insecurity: Not on file  Transportation Needs: Not on file  Physical Activity: Not on file  Stress: Not on file  Social Connections: Not on file    Review of Systems  Constitutional:  Negative for chills, diaphoresis and fever.  HENT:  Negative for congestion, ear pain and sore throat.   Respiratory:  Negative for cough.   Cardiovascular:  Negative for leg swelling.  Gastrointestinal:  Negative for abdominal pain, constipation, diarrhea, nausea and vomiting.  Genitourinary:  Negative for dysuria and urgency.  Musculoskeletal:  Negative for arthralgias.  Psychiatric/Behavioral:  Negative for dysphoric mood.      Objective:  There were no vitals taken for this visit.     07/03/2021   10:01 AM 02/13/2021    9:58 AM 12/05/2020    9:04 AM  BP/Weight  Systolic BP [id number]  Diastolic BP 88 123XX123 85  Wt. (  Lbs) 207 210.3 206  BMI 29.7 kg/m2 30.17 kg/m2 29.56 kg/m2    Physical Exam  Diabetic Foot Exam - Simple   No data filed      Lab Results  Component Value Date   WBC 6.9 07/03/2021   HGB 14.5 07/03/2021   HCT 45.1 07/03/2021   PLT 228 07/03/2021   GLUCOSE 198 (H) 07/03/2021   CHOL 120 07/03/2021   TRIG 192 (H) 07/03/2021   HDL 42 07/03/2021   LDLCALC 47 07/03/2021   ALT 23 07/03/2021   AST 21 07/03/2021   NA 133 (L) 07/03/2021   K 4.5 07/03/2021   CL 97 07/03/2021   CREATININE 0.98 07/03/2021   BUN 13 07/03/2021   CO2 21 07/03/2021   HGBA1C 8.9 (H) 07/03/2021      Assessment & Plan:   Problem List Items Addressed This Visit       Cardiovascular and Mediastinum   Benign essential hypertension     Other   Mixed hyperlipidemia   Other Visit  Diagnoses     Type 2 diabetes mellitus without complication, without long-term current use of insulin (HCC)    -  Primary     .  No orders of the defined types were placed in this encounter.   No orders of the defined types were placed in this encounter.    Follow-up: No follow-ups on file.  An After Visit Summary was printed and given to the patient.  Janie Morning, NP [facility] [phone]

## 2022-01-15 ENCOUNTER — Encounter: Payer: BC Managed Care – PPO | Admitting: Nurse Practitioner

## 2022-01-15 DIAGNOSIS — E782 Mixed hyperlipidemia: Secondary | ICD-10-CM

## 2022-01-15 DIAGNOSIS — E119 Type 2 diabetes mellitus without complications: Secondary | ICD-10-CM

## 2022-01-15 DIAGNOSIS — I10 Essential (primary) hypertension: Secondary | ICD-10-CM

## 2022-03-16 NOTE — Progress Notes (Signed)
This encounter was created in error - please disregard.

## 2023-01-20 DIAGNOSIS — K0889 Other specified disorders of teeth and supporting structures: Secondary | ICD-10-CM | POA: Diagnosis not present

## 2023-01-20 DIAGNOSIS — I10 Essential (primary) hypertension: Secondary | ICD-10-CM | POA: Diagnosis not present

## 2023-01-20 DIAGNOSIS — K047 Periapical abscess without sinus: Secondary | ICD-10-CM | POA: Diagnosis not present

## 2023-04-29 NOTE — Progress Notes (Signed)
Letter sent - patient needs appointment with fasting bloodwork

## 2023-12-21 ENCOUNTER — Ambulatory Visit: Payer: Self-pay | Admitting: *Deleted

## 2023-12-21 NOTE — Telephone Encounter (Signed)
 Pt last seen 06/2021 by Dr. Rea Everett.     FYI Only or Action Required?: FYI only for provider  Patient was last seen in primary care on 07/03/2021 by Clayton Bleacher, MD. Called Nurse Triage reporting Leg Swelling. Symptoms began a couple of months ago bilateral foot and ankles swelling.   Also toenail on left big toe has come off.  Has diabetes.  Interventions attempted: Nothing. Symptoms are: gradually worsening.  Triage Disposition: See Physician Within 24 Hours  Patient/caregiver understands and will follow disposition?: Copied from CRM [id number]. Topic: Appointments - Appointment Scheduling >> Dec 21, 2023 11:49 AM Clayton Everett: Clayton Everett is calling to schedule an appointment. Refer to attachments for appointment information.  Patient has swelling in both feet Reason for Disposition  Looks like a boil, infected sore, deep ulcer or other infected rash (spreading redness, pus)    Toenail and skin have come off of left big toe.   Both feet and ankles  Answer Assessment - Initial Assessment Questions 1. ONSET: "When did the swelling start?" (e.g., minutes, hours, days)     I was last there in December 2022 when Dr. Rea Everett was still there.   He retired.    2. LOCATION: "What part of the leg is swollen?"  "Are both legs swollen or just one leg?"     I'm having swelling in both ankles and feet.   It's my ankles and feet.  The swelling started a couple of months ago.     3. SEVERITY: "How bad is the swelling?" (e.g., localized; mild, moderate, severe)   - Localized: Small area of swelling localized to one leg.   - MILD pedal edema: Swelling limited to foot and ankle, pitting edema < 1/4 inch (6 mm) deep, rest and elevation eliminate most or all swelling.   - MODERATE edema: Swelling of lower leg to knee, pitting edema > 1/4 inch (6 mm) deep, rest and elevation only partially reduce swelling.   - SEVERE edema: Swelling extends above knee, facial or  hand swelling present.      Last night the tip of my toe, the skin, nail and all fell off on second toe on left foot.   No accidents or injuries.   I do have diabetes.   I do have a fungus in that toenail.    4. REDNESS: "Does the swelling look red or infected?"     My toe is not bleeding or looking infected.   I have another spot on my big toe that is crusting over and the skin is dead.  This happened next to my big toe on left foot. 5. PAIN: "Is the swelling painful to touch?" If Yes, ask: "How painful is it?"   (Scale 1-10; mild, moderate or severe)     My feet hurt.    I'm on my feet for 12 hour shifts.  So my feet do hurt. 6. FEVER: "Do you have a fever?" If Yes, ask: "What is it, how was it measured, and when did it start?"      No 7. CAUSE: "What do you think is causing the leg swelling?"     I had a fungus in my toenail where the nail fell off. 8. MEDICAL HISTORY: "Do you have a history of blood clots (e.g., DVT), cancer, heart failure, kidney disease, or liver failure?"     No blood clots in legs 9. RECURRENT SYMPTOM: "Have you had leg swelling before?" If Yes, ask: "When  was the last time?" "What happened that time?"     This is the first time my ankles and feet have swelled up.    10. OTHER SYMPTOMS: "Do you have any other symptoms?" (e.g., chest pain, difficulty breathing)       No shortness of breath or chest tightness. 11. PREGNANCY: "Is there any chance you are pregnant?" "When was your last menstrual period?"       N/A  Protocols used: Leg Swelling and Edema-A-AH

## 2023-12-22 ENCOUNTER — Ambulatory Visit (HOSPITAL_BASED_OUTPATIENT_CLINIC_OR_DEPARTMENT_OTHER)
Admission: RE | Admit: 2023-12-22 | Discharge: 2023-12-22 | Disposition: A | Discharge status: Home / Self Care | Source: Ambulatory Visit

## 2023-12-22 ENCOUNTER — Ambulatory Visit

## 2023-12-22 ENCOUNTER — Telehealth: Payer: Self-pay

## 2023-12-22 ENCOUNTER — Other Ambulatory Visit: Payer: Self-pay

## 2023-12-22 ENCOUNTER — Ambulatory Visit: Payer: Self-pay

## 2023-12-22 VITALS — BP 168/88 | HR 96 | Temp 97.4°F | Ht 70.0 in | Wt 193.4 lb

## 2023-12-22 DIAGNOSIS — F17200 Nicotine dependence, unspecified, uncomplicated: Secondary | ICD-10-CM | POA: Diagnosis not present

## 2023-12-22 DIAGNOSIS — L97522 Non-pressure chronic ulcer of other part of left foot with fat layer exposed: Secondary | ICD-10-CM

## 2023-12-22 DIAGNOSIS — I10 Essential (primary) hypertension: Secondary | ICD-10-CM | POA: Diagnosis not present

## 2023-12-22 DIAGNOSIS — I96 Gangrene, not elsewhere classified: Secondary | ICD-10-CM

## 2023-12-22 DIAGNOSIS — E782 Mixed hyperlipidemia: Secondary | ICD-10-CM | POA: Diagnosis not present

## 2023-12-22 DIAGNOSIS — E1165 Type 2 diabetes mellitus with hyperglycemia: Secondary | ICD-10-CM

## 2023-12-22 NOTE — Assessment & Plan Note (Signed)
 He continues to smoke cigarettes, which is detrimental to his health, particularly in the context of diabetes and potential vascular issues. Immediate reduction in smoking is advised. - Advise him to reduce cigarette consumption immediately - Offer support for smoking cessation if needed  He did not seem keen on cutting back or quitting at this time. Will continue to make recommendation.

## 2023-12-22 NOTE — Patient Instructions (Addendum)
 MY RECOMMENDATION IS FOR YOU TO BE EVLAUATED AS SOON AS POSSIBLE AT THE VASCULAR SURGEON'S OFFICE OR AT THE EMERGENCY ROOM AS YOUR LEFT FOOT NEEDS IMMEDIATE ATTENTION.  I HAVE ORDERED ULTRASOUND OF YOUR LEFT LEG TO CHECK FOR CIRCULATION, AND PLACED AN URGENT REFERRAL FOR VASCULAR SURGERY   VISIT SUMMARY: Clayton Everett, a 53 year old male with uncontrolled diabetes, presented with a gangrenous left great toe. He has not been on diabetes or blood pressure medications for several years and has a history of high blood pressure and past cocaine abuse. He continues to smoke cigarettes and marijuana. Immediate medical intervention is required to address the gangrene and re-establish management of his diabetes and hypertension.  YOUR PLAN: GANGRENE OF THE LEFT GREAT TOE: Your left great toe has gangrene due to a loss of blood supply, which needs immediate attention to prevent further complications. -We will order x-rays and vascular testing of your legs. -You will be referred to a vascular specialist for further evaluation and management. -We will take photographs of the affected toe for documentation. -It is crucial to act immediately to address this condition. -We will coordinate your referral to a vascular specialist, likely in [place].  UNCONTROLLED TYPE 2 DIABETES MELLITUS: Your diabetes is not under control, and you have not been on medications for the past few years. This may be contributing to the gangrene in your toe. -We will order blood work to assess your blood glucose levels, kidney function, and lipid profile. -We will restart your diabetes management with appropriate medications based on your blood work results.  HYPERTENSION: Your blood pressure is elevated, and you have not been on antihypertensive medications recently. -We will monitor your blood pressure and consider restarting antihypertensive medications based on further evaluation.  TOBACCO USE DISORDER: You continue to smoke  cigarettes, which is harmful to your health, especially with your diabetes and potential vascular issues. -You should reduce cigarette consumption immediately. -We can offer support for smoking cessation if needed.  GENERAL HEALTH MAINTENANCE: You have not engaged in routine health maintenance for several years and have not been on any medications. -We will perform comprehensive blood work to evaluate your overall health status. -You should make lifestyle modifications, including smoking cessation and diabetes management.                      Contains text generated by [employer].                                 Contains text generated by [employer]bridge.

## 2023-12-22 NOTE — Assessment & Plan Note (Addendum)
 UNCONTROLLED DIABETES TYPE 2 WITH HYPERGLYCEMIA WITH VASCULAR COMPLICATIONS, WITHOUT LONG TERM USE OF INSULIN. He has uncontrolled diabetes and has not been on medications for the past couple of years. Previous blood work indicated poor control, likely while on medication. Current control is unknown, and diabetes may be contributing to the gangrene in the left great toe. Re-establishing diabetes management is crucial. - Order blood work to assess blood glucose levels, kidney function, and lipid profile - Reinitiate diabetes management with appropriate medications based on blood work results

## 2023-12-22 NOTE — Progress Notes (Signed)
 Acute Office Visit  Subjective:    Patient ID: Clayton Everett, male    DOB: Mar 15, 1971, 53 y.o.   MRN: [id number]  Chief Complaint  Patient presents with   Edema    Foot and ankle    HPI: Discussed the use of AI scribe software for clinical note transcription with the patient, who gave verbal consent to proceed. PATIENT IS NOT CONSIDERED A NEW PATIENT HAS IT HAS BEEN LESS THAN 3 YEARS SINCE HIS LAST VISIT History of Present Illness   Discussed the use of AI scribe software for clinical note transcription with the patient, who gave verbal consent to proceed.  History of Present Illness   Clayton Everett is a 53 year old male with uncontrolled diabetes who presents TO ESTABLISH CARE and to have his feet looked at, as he noticed that his left second toe "TIP FELL OFF" couple days ago.  He has experienced discoloration and worsening of his left great toe over the past day and a half, with the condition progressively worsening. He has not sought medical attention until now. He reports some pain in the foot and neuropathy, with reduced sensation compared to before. Notably, his second toe fell off two days ago.  He has a history of diabetes with previous blood work in 2022 indicating poor control. He has not been on any diabetes medications for the past few years and has not seen a doctor during this time. He acknowledges neglecting his health due to caring for his wife, who has had two strokes in the past three years.  He has a history of high blood pressure, with a recent reading of 140, which he considers high. He has not been on any antihypertensive medications recently.  He has a history of cocaine abuse but has not used cocaine since 2007. He continues to smoke marijuana and cigarettes. He is employed as a Furniture conservator/restorer and has insurance through [employer].   smokes about 1.5 packs a day.   No chest pain or trouble breathing. Reports some pain in the left foot and  neuropathy.           Past Medical History:  Diagnosis Date   Asthma    Benign essential hypertension 06/30/2020   Chest pain 11/07/2020   Cocaine abuse in remission (HCC) 08/07/2020   Diabetes mellitus, type 2 (HCC) 06/30/2020   Marijuana abuse, continuous 08/07/2020   Mixed hyperlipidemia 06/30/2020   Obesity, diabetes, and hypertension syndrome (HCC) 09/08/2020    Past Surgical History:  Procedure Laterality Date   none      Family History  Problem Relation Age of Onset   Congestive Heart Failure Mother    Diabetes type II Other    COPD Other    Hypertension Other    Congestive Heart Failure Other    Diabetes Father     Social History   Socioeconomic History   Marital status: Legally Separated    Spouse name: Not on file   Number of children: Not on file   Years of education: Not on file   Highest education level: Not on file  Occupational History   Occupation: Designer, television/film set  Tobacco Use   Smoking status: Some Days    Current packs/day: 1.50    Types: Cigarettes   Smokeless tobacco: Never  Substance and Sexual Activity   Alcohol use: Yes    Alcohol/week: 1.0 standard drink of alcohol    Types: 1 Cans of beer per week  Comment: weekly    Drug use: Yes    Frequency: 1.0 times per week    Types: Marijuana   Sexual activity: Yes    Partners: Female  Other Topics Concern   Not on file  Social History Narrative   Not on file   Social Drivers of Health   Financial Resource Strain: Medium Risk (12/22/2023)   Overall Financial Resource Strain (CARDIA)    Difficulty of Paying Living Expenses: Somewhat hard  Food Insecurity: No Food Insecurity (12/22/2023)   Hunger Vital Sign    Worried About Running Out of Food in the Last Year: Never true    Ran Out of Food in the Last Year: Never true  Transportation Needs: No Transportation Needs (12/22/2023)   PRAPARE - Administrator, Civil Service (Medical): No    Lack of Transportation (Non-Medical): No   Physical Activity: Inactive (12/22/2023)   Exercise Vital Sign    Days of Exercise per Week: 0 days    Minutes of Exercise per Session: 0 min  Stress: No Stress Concern Present (12/22/2023)   [employer] of Occupational Health - Occupational Stress Questionnaire    Feeling of Stress : Only a little  Social Connections: Socially Isolated (12/22/2023)   Social Connection and Isolation Panel [NHANES]    Frequency of Communication with Friends and Family: Twice a week    Frequency of Social Gatherings with Friends and Family: Never    Attends Religious Services: Never    Database administrator or Organizations: No    Attends Banker Meetings: Never    Marital Status: Married  Catering manager Violence: Not At Risk (12/22/2023)   Humiliation, Afraid, Rape, and Kick questionnaire    Fear of Current or Ex-Partner: No    Emotionally Abused: No    Physically Abused: No    Sexually Abused: No    Outpatient Medications Prior to Visit  Medication Sig Dispense Refill   chlorthalidone  (HYGROTON ) 25 MG tablet Take 1 tablet (25 mg total) by mouth daily. Not available as 15mg  any more 90 tablet 2   metFORMIN  (GLUCOPHAGE ) 1000 MG tablet Take 1 tablet (1,000 mg total) by mouth 2 (two) times daily with a meal. 180 tablet 3   nicotine  (NICODERM CQ ) 21 mg/24hr patch Place 1 patch (21 mg total) onto the skin daily. 28 patch 0   pravastatin  (PRAVACHOL ) 40 MG tablet Take 1 tablet (40 mg total) by mouth daily. 90 tablet 3   sildenafil  (VIAGRA ) 50 MG tablet Take 1 tablet (50 mg total) by mouth daily as needed for erectile dysfunction. 10 tablet 3   tirzepatide  (MOUNJARO ) 5 MG/0.5ML Pen Inject 5 mg into the skin once a week. 6 mL 4   valsartan  (DIOVAN ) 80 MG tablet Take 1 tablet (80 mg total) by mouth daily. 90 tablet 3   No facility-administered medications prior to visit.    Allergies  Allergen Reactions   Penicillins Shortness Of Breath    Review of Systems  Constitutional:  Negative  for chills, fatigue and fever.  HENT:  Negative for congestion, ear pain and sinus pain.   Respiratory:  Negative for cough and shortness of breath.   Cardiovascular:  Positive for leg swelling. Negative for chest pain.  Gastrointestinal:  Negative for abdominal pain, constipation, diarrhea, nausea and vomiting.  Musculoskeletal:  Positive for joint swelling. Negative for myalgias.  Skin:  Positive for color change and wound.  Neurological:  Positive for numbness. Negative for headaches.  Objective:         12/22/2023    9:03 AM 12/22/2023    8:17 AM 07/03/2021   10:01 AM  Vitals with BMI  Height  5\' 10"  5\' 10"   Weight  193 lbs 6 oz 207 lbs  BMI  27.75 29.7  Systolic 168 142 782  Diastolic 88 82 88  Pulse  96 105    No data found.   Physical Exam Vitals and nursing note reviewed.  HENT:     Head: Normocephalic and atraumatic.     Mouth/Throat:     Comments: Very poor dentition, several broken, missing teeth Cardiovascular:     Rate and Rhythm: Normal rate and regular rhythm.  Pulmonary:     Effort: Pulmonary effort is normal.     Breath sounds: Normal breath sounds.  Musculoskeletal:        General: Swelling (both feet, erythema) present.  Skin:    Comments: EXTREMITIES: Left great toe appears GANGRENOUS, BLACK IN APPEAREANCE, DISTORTED OVERGROWN TOE NAIL NOTED. BLACK ESCHAR NOTED ON THE BOTTOM OF THE TOE. LEFT SECOND TOE NAIL HAS A RAW ULCER AT THE TIP. BLACK DISCOLORATION ON THE BOTTOM WHICH COULD BE GANGRENE OR ESCHAR SWELLING OF BOTH FEET. POOR DORSALIS PEDIS PULSES. VERY OVERGROWN AND DISTORTED TOENAILS ALL TOES, ONYCHOMYCOSIS. ABNORMAL MONOFILAMENT TESTING BILATERALLY  Neurological:     Mental Status: He is alert.     Health Maintenance Due  Topic Date Due   HIV Screening  Never done   Diabetic kidney evaluation - Urine ACR  Never done   Hepatitis C Screening  Never done   Colonoscopy  Never done   HEMOGLOBIN A1C  01/01/2022   Diabetic  kidney evaluation - eGFR measurement  07/03/2022   FOOT EXAM  07/03/2022    There are no preventive care reminders to display for this patient.   No results found for: "TSH" Lab Results  Component Value Date   WBC 6.9 07/03/2021   HGB 14.5 07/03/2021   HCT 45.1 07/03/2021   MCV 90 07/03/2021   PLT 228 07/03/2021   Lab Results  Component Value Date   NA 133 (L) 07/03/2021   K 4.5 07/03/2021   CO2 21 07/03/2021   GLUCOSE 198 (H) 07/03/2021   BUN 13 07/03/2021   CREATININE 0.98 07/03/2021   BILITOT 0.3 07/03/2021   ALKPHOS 84 07/03/2021   AST 21 07/03/2021   ALT 23 07/03/2021   PROT 6.8 07/03/2021   ALBUMIN 4.4 07/03/2021   CALCIUM 9.5 07/03/2021   EGFR 93 07/03/2021   Lab Results  Component Value Date   CHOL 120 07/03/2021   Lab Results  Component Value Date   HDL 42 07/03/2021   Lab Results  Component Value Date   LDLCALC 47 07/03/2021   Lab Results  Component Value Date   TRIG 192 (H) 07/03/2021   Lab Results  Component Value Date   CHOLHDL 2.9 07/03/2021   Lab Results  Component Value Date   HGBA1C 8.9 (H) 07/03/2021       Assessment & Plan:  Uncontrolled type 2 diabetes mellitus with hyperglycemia (HCC) Assessment & Plan: UNCONTROLLED DIABETES TYPE 2 WITH HYPERGLYCEMIA WITH VASCULAR COMPLICATIONS, WITHOUT LONG TERM USE OF INSULIN. He has uncontrolled diabetes and has not been on medications for the past couple of years. Previous blood work indicated poor control, likely while on medication. Current control is unknown, and diabetes may be contributing to the gangrene in the left great toe. Re-establishing diabetes management is crucial. -  Order blood work to assess blood glucose levels, kidney function, and lipid profile - Reinitiate diabetes management with appropriate medications based on blood work results  Orders: -     CBC with Differential/Platelet -     Comprehensive metabolic panel with GFR -     Hemoglobin A1c -     Lipid panel -      Microalbumin / creatinine urine ratio -     Sedimentation rate -     C-reactive protein -     Ambulatory referral to Vascular Surgery -     VAS US  ABI WITH/WO TBI; Future  Tobacco use disorder Assessment & Plan: He continues to smoke cigarettes, which is detrimental to his health, particularly in the context of diabetes and potential vascular issues. Immediate reduction in smoking is advised. - Advise him to reduce cigarette consumption immediately - Offer support for smoking cessation if needed  He did not seem keen on cutting back or quitting at this time. Will continue to make recommendation.   Orders: -     CBC with Differential/Platelet -     Comprehensive metabolic panel with GFR -     Hemoglobin A1c -     Lipid panel -     Microalbumin / creatinine urine ratio -     Sedimentation rate -     C-reactive protein -     VAS US  ABI WITH/WO TBI; Future  Benign essential hypertension Assessment & Plan: He has elevated blood pressure with a recent reading of 140 mmHg and has not been on antihypertensive medications recently. Monitoring and potential medication management are necessary. - Monitor blood pressure and consider restarting antihypertensive medications based on further evaluation  Orders: -     CBC with Differential/Platelet -     Comprehensive metabolic panel with GFR -     Hemoglobin A1c -     Lipid panel -     Microalbumin / creatinine urine ratio -     Sedimentation rate -     C-reactive protein  Mixed hyperlipidemia -     CBC with Differential/Platelet -     Comprehensive metabolic panel with GFR -     Hemoglobin A1c -     Lipid panel -     Microalbumin / creatinine urine ratio -     Sedimentation rate -     C-reactive protein  Skin ulcer of second toe of left foot with fat layer exposed (HCC) -     CBC with Differential/Platelet -     Comprehensive metabolic panel with GFR -     Hemoglobin A1c -     Lipid panel -     Microalbumin / creatinine urine  ratio -     Sedimentation rate -     C-reactive protein -     Ambulatory referral to Vascular Surgery -     VAS US  ABI WITH/WO TBI; Future  Gangrene of toe of left foot (HCC) Assessment & Plan: Gangrene of the left great toe The left great toe is gangrenous due to loss of blood supply, necessitating immediate intervention to prevent further complications, including potential limb loss. He reports some pain but has neuropathy, affecting sensation. The condition has been present for a while, with the second toe having recently auto-amputated. RECOMMENDED THAT HE NEEDS TO TREAT IT AS AN EMERGENCY AND NEEDS TO HAVE THE VASCULAR STUDIES DONE AND BE EVALUATED AT EMERGENCY ROOM OR A VASCULAR SURGEON'S OFFICE, BUT HE IS RELUCTANT TO GO TO  [place] TODAY AS HE HAS A JOB AND A SICK WIFE TO TAKE CARE OF.  HE STATES HE WOULD GO ON MONDAY., DESPITE REPEATED RECOMMENDATION AND EXPLANATION OF HIS CONDITION.  - Ordered STAT vascular testing of the legs - Refer to a vascular specialist URGENT for further evaluation and management - Take photographs of the affected toe for documentation - Educate him on the urgency of the condition and the need for immediate action - Coordinate referral to a vascular specialist, likely in [facility] Maintenance He has not engaged in routine health maintenance for several years and has not been on any medications. There is a need to re-establish routine health maintenance, including monitoring chronic conditions and lifestyle modifications. - Perform comprehensive blood work to evaluate overall health status - Encourage lifestyle modifications, including smoking cessation and diabetes management   Orders: -     CBC with Differential/Platelet -     Comprehensive metabolic panel with GFR -     Hemoglobin A1c -     Lipid panel -     Microalbumin / creatinine urine ratio -     Sedimentation rate -     C-reactive protein -     Ambulatory referral to  Vascular Surgery -     VAS US  ABI WITH/WO TBI; Future   Assessment and Plan            No orders of the defined types were placed in this encounter.   Orders Placed This Encounter  Procedures   CBC with Differential/Platelet   Comprehensive metabolic panel with GFR   Hemoglobin A1c   Lipid panel   Microalbumin / creatinine urine ratio   Sedimentation rate   C-reactive protein   Ambulatory referral to Vascular Surgery   VAS US  ABI WITH/WO TBI     Follow-up: No follow-ups on file.  An After Visit Summary was printed and given to the patient. Total time spent on today's visit was 55 minutes, including both face-to-face time and nonface-to-face time personally spent on review of chart (labs and imaging), discussing labs and goals, discussing further work-up, treatment options, referrals to specialist if needed, reviewing outside records of pertinent, answering patient's questions, and coordinating care.   Djimon Lundstrom, MD [facility] [phone]

## 2023-12-22 NOTE — Telephone Encounter (Addendum)
 Received note from Dr. Loistine Rinne re: pt's left foot. Provider states that pt came in for a check up and upon assessment discovered the patient was missing part of his second toe on the left foot and the great toe and third toe were black.  Top of foot was red and edematous. Patient is diabetic.   Provider stated that the pt hasn't received healthcare in several years. Provider asked if he could be seen by a VVS provider. Triage VVS nurse suggested pt go to the [facility] ED. Pt refused to go to the ED stating he has "things to take care of until Monday"!!!! Dr. Sirivol was going to try to schedule ABIs in [place]. Dr. Sirivol agreed to keep us  up-to-date on the plan of care.   Pt's ABIs were done in [facility], 12/22/2023.  Referral #[id number] from Dr. Sirivol was sent to schedulers to progress patient care.  All his blood work drawn this morning to look at his A1C, kidney function, sed rate and CRP to rule out osteomyelitis.

## 2023-12-22 NOTE — Assessment & Plan Note (Addendum)
 Gangrene of the left great toe The left great toe is gangrenous due to loss of blood supply, necessitating immediate intervention to prevent further complications, including potential limb loss. He reports some pain but has neuropathy, affecting sensation. The condition has been present for a while, with the second toe having recently auto-amputated. RECOMMENDED THAT HE NEEDS TO TREAT IT AS AN EMERGENCY AND NEEDS TO HAVE THE VASCULAR STUDIES DONE AND BE EVALUATED AT EMERGENCY ROOM OR A VASCULAR SURGEON'S OFFICE, BUT HE IS RELUCTANT TO GO TO [place] TODAY AS HE HAS A JOB AND A SICK WIFE TO TAKE CARE OF.  HE STATES HE WOULD GO ON MONDAY., DESPITE REPEATED RECOMMENDATION AND EXPLANATION OF HIS CONDITION.  - Ordered STAT vascular testing of the legs - Refer to a vascular specialist URGENT for further evaluation and management - Take photographs of the affected toe for documentation - Educate him on the urgency of the condition and the need for immediate action - Coordinate referral to a vascular specialist, likely in [facility] Maintenance He has not engaged in routine health maintenance for several years and has not been on any medications. There is a need to re-establish routine health maintenance, including monitoring chronic conditions and lifestyle modifications. - Perform comprehensive blood work to evaluate overall health status - Encourage lifestyle modifications, including smoking cessation and diabetes management

## 2023-12-22 NOTE — Assessment & Plan Note (Signed)
 He has elevated blood pressure with a recent reading of 140 mmHg and has not been on antihypertensive medications recently. Monitoring and potential medication management are necessary. - Monitor blood pressure and consider restarting antihypertensive medications based on further evaluation

## 2023-12-23 ENCOUNTER — Telehealth: Payer: Self-pay

## 2023-12-23 DIAGNOSIS — E871 Hypo-osmolality and hyponatremia: Secondary | ICD-10-CM | POA: Diagnosis not present

## 2023-12-23 DIAGNOSIS — I70202 Unspecified atherosclerosis of native arteries of extremities, left leg: Secondary | ICD-10-CM | POA: Diagnosis not present

## 2023-12-23 DIAGNOSIS — E1169 Type 2 diabetes mellitus with other specified complication: Secondary | ICD-10-CM | POA: Diagnosis not present

## 2023-12-23 DIAGNOSIS — E1152 Type 2 diabetes mellitus with diabetic peripheral angiopathy with gangrene: Secondary | ICD-10-CM | POA: Diagnosis not present

## 2023-12-23 DIAGNOSIS — M86172 Other acute osteomyelitis, left ankle and foot: Secondary | ICD-10-CM | POA: Diagnosis not present

## 2023-12-23 DIAGNOSIS — E11621 Type 2 diabetes mellitus with foot ulcer: Secondary | ICD-10-CM | POA: Diagnosis not present

## 2023-12-23 DIAGNOSIS — L97529 Non-pressure chronic ulcer of other part of left foot with unspecified severity: Secondary | ICD-10-CM | POA: Diagnosis not present

## 2023-12-23 LAB — VAS US ABI WITH/WO TBI
Left ABI: 0.43
Right ABI: 1.14

## 2023-12-23 NOTE — Telephone Encounter (Signed)
 Pt was called by scheduler to set up appointment for his angiogram. Pt acted like he didn't know what was going on.  Triage nurse called pt to discuss his need for the angiogram given the appearance of his left foot and ABI results. Pt was advised to make the appt for the angiogram sooner than later and that he was at risk of losing his left foot and possibly his left leg.   Pt stated that he understood the gravity of the situation and agreed to make the appt. Pt confirmed that he has transportation.  Pt has scheduled appt on 12/26/2023 with Dr. Charlotte Cookey to discuss the plan for his care going forward. Pt notified

## 2023-12-24 DIAGNOSIS — Z91148 Patient's other noncompliance with medication regimen for other reason: Secondary | ICD-10-CM | POA: Diagnosis not present

## 2023-12-24 DIAGNOSIS — M79675 Pain in left toe(s): Secondary | ICD-10-CM | POA: Diagnosis not present

## 2023-12-24 DIAGNOSIS — L97524 Non-pressure chronic ulcer of other part of left foot with necrosis of bone: Secondary | ICD-10-CM | POA: Diagnosis not present

## 2023-12-24 DIAGNOSIS — M7989 Other specified soft tissue disorders: Secondary | ICD-10-CM | POA: Diagnosis not present

## 2023-12-24 DIAGNOSIS — B952 Enterococcus as the cause of diseases classified elsewhere: Secondary | ICD-10-CM | POA: Diagnosis not present

## 2023-12-24 DIAGNOSIS — L97529 Non-pressure chronic ulcer of other part of left foot with unspecified severity: Secondary | ICD-10-CM | POA: Diagnosis not present

## 2023-12-24 DIAGNOSIS — M86172 Other acute osteomyelitis, left ankle and foot: Secondary | ICD-10-CM | POA: Diagnosis not present

## 2023-12-24 DIAGNOSIS — E11621 Type 2 diabetes mellitus with foot ulcer: Secondary | ICD-10-CM | POA: Diagnosis not present

## 2023-12-24 DIAGNOSIS — I743 Embolism and thrombosis of arteries of the lower extremities: Secondary | ICD-10-CM | POA: Diagnosis not present

## 2023-12-24 DIAGNOSIS — M869 Osteomyelitis, unspecified: Secondary | ICD-10-CM | POA: Diagnosis not present

## 2023-12-24 DIAGNOSIS — B9689 Other specified bacterial agents as the cause of diseases classified elsewhere: Secondary | ICD-10-CM | POA: Diagnosis not present

## 2023-12-24 DIAGNOSIS — I10 Essential (primary) hypertension: Secondary | ICD-10-CM | POA: Diagnosis not present

## 2023-12-24 DIAGNOSIS — I8290 Acute embolism and thrombosis of unspecified vein: Secondary | ICD-10-CM | POA: Diagnosis not present

## 2023-12-24 DIAGNOSIS — F17210 Nicotine dependence, cigarettes, uncomplicated: Secondary | ICD-10-CM | POA: Diagnosis not present

## 2023-12-24 DIAGNOSIS — Z880 Allergy status to penicillin: Secondary | ICD-10-CM | POA: Diagnosis not present

## 2023-12-24 DIAGNOSIS — L97528 Non-pressure chronic ulcer of other part of left foot with other specified severity: Secondary | ICD-10-CM | POA: Diagnosis not present

## 2023-12-24 DIAGNOSIS — E1152 Type 2 diabetes mellitus with diabetic peripheral angiopathy with gangrene: Secondary | ICD-10-CM | POA: Diagnosis not present

## 2023-12-24 DIAGNOSIS — Z89422 Acquired absence of other left toe(s): Secondary | ICD-10-CM | POA: Diagnosis not present

## 2023-12-24 DIAGNOSIS — E1165 Type 2 diabetes mellitus with hyperglycemia: Secondary | ICD-10-CM | POA: Diagnosis not present

## 2023-12-24 DIAGNOSIS — I96 Gangrene, not elsewhere classified: Secondary | ICD-10-CM | POA: Diagnosis not present

## 2023-12-24 DIAGNOSIS — L03032 Cellulitis of left toe: Secondary | ICD-10-CM | POA: Diagnosis not present

## 2023-12-24 DIAGNOSIS — I70202 Unspecified atherosclerosis of native arteries of extremities, left leg: Secondary | ICD-10-CM | POA: Diagnosis not present

## 2023-12-24 DIAGNOSIS — E871 Hypo-osmolality and hyponatremia: Secondary | ICD-10-CM | POA: Diagnosis not present

## 2023-12-24 DIAGNOSIS — E11628 Type 2 diabetes mellitus with other skin complications: Secondary | ICD-10-CM | POA: Diagnosis not present

## 2023-12-24 DIAGNOSIS — E1140 Type 2 diabetes mellitus with diabetic neuropathy, unspecified: Secondary | ICD-10-CM | POA: Diagnosis not present

## 2023-12-24 DIAGNOSIS — Z79899 Other long term (current) drug therapy: Secondary | ICD-10-CM | POA: Diagnosis not present

## 2023-12-24 DIAGNOSIS — E1169 Type 2 diabetes mellitus with other specified complication: Secondary | ICD-10-CM | POA: Diagnosis not present

## 2023-12-24 DIAGNOSIS — B961 Klebsiella pneumoniae [K. pneumoniae] as the cause of diseases classified elsewhere: Secondary | ICD-10-CM | POA: Diagnosis not present

## 2023-12-24 DIAGNOSIS — I719 Aortic aneurysm of unspecified site, without rupture: Secondary | ICD-10-CM | POA: Diagnosis not present

## 2023-12-24 DIAGNOSIS — I739 Peripheral vascular disease, unspecified: Secondary | ICD-10-CM | POA: Diagnosis not present

## 2023-12-24 DIAGNOSIS — Z7984 Long term (current) use of oral hypoglycemic drugs: Secondary | ICD-10-CM | POA: Diagnosis not present

## 2023-12-26 ENCOUNTER — Ambulatory Visit: Admitting: Surgery

## 2023-12-26 NOTE — Progress Notes (Deleted)
 Vascular and Vein Specialist of [facility]  Patient name: Clayton Everett MRN: [id number] DOB: February 15, 1971 Sex: male   REQUESTING PROVIDER:    Brenna Cam   REASON FOR CONSULT:    Left toe ulcer  HISTORY OF PRESENT ILLNESS:   Clayton Everett is a 53 y.o. male, who is ***  PAST MEDICAL HISTORY    Past Medical History:  Diagnosis Date   Asthma    Benign essential hypertension 06/30/2020   Chest pain 11/07/2020   Cocaine abuse in remission (HCC) 08/07/2020   Diabetes mellitus, type 2 (HCC) 06/30/2020   Marijuana abuse, continuous 08/07/2020   Mixed hyperlipidemia 06/30/2020   Obesity, diabetes, and hypertension syndrome (HCC) 09/08/2020     FAMILY HISTORY   Family History  Problem Relation Age of Onset   Congestive Heart Failure Mother    Diabetes type II Other    COPD Other    Hypertension Other    Congestive Heart Failure Other    Diabetes Father     SOCIAL HISTORY:   Social History   Socioeconomic History   Marital status: Legally Separated    Spouse name: Not on file   Number of children: Not on file   Years of education: Not on file   Highest education level: Not on file  Occupational History   Occupation: Designer, television/film set  Tobacco Use   Smoking status: Some Days    Current packs/day: 1.50    Types: Cigarettes   Smokeless tobacco: Never  Substance and Sexual Activity   Alcohol use: Yes    Alcohol/week: 1.0 standard drink of alcohol    Types: 1 Cans of beer per week    Comment: weekly    Drug use: Yes    Frequency: 1.0 times per week    Types: Marijuana   Sexual activity: Yes    Partners: Female  Other Topics Concern   Not on file  Social History Narrative   Not on file   Social Drivers of Health   Financial Resource Strain: Medium Risk (12/22/2023)   Overall Financial Resource Strain (CARDIA)    Difficulty of Paying Living Expenses: Somewhat hard  Food Insecurity: No Food Insecurity (12/22/2023)   Hunger Vital  Sign    Worried About Running Out of Food in the Last Year: Never true    Ran Out of Food in the Last Year: Never true  Transportation Needs: No Transportation Needs (12/22/2023)   PRAPARE - Administrator, Civil Service (Medical): No    Lack of Transportation (Non-Medical): No  Physical Activity: Inactive (12/22/2023)   Exercise Vital Sign    Days of Exercise per Week: 0 days    Minutes of Exercise per Session: 0 min  Stress: No Stress Concern Present (12/22/2023)   [employer] of Occupational Health - Occupational Stress Questionnaire    Feeling of Stress : Only a little  Social Connections: Socially Isolated (12/22/2023)   Social Connection and Isolation Panel [NHANES]    Frequency of Communication with Friends and Family: Twice a week    Frequency of Social Gatherings with Friends and Family: Never    Attends Religious Services: Never    Database administrator or Organizations: No    Attends Banker Meetings: Never    Marital Status: Married  Catering manager Violence: Not At Risk (12/22/2023)   Humiliation, Afraid, Rape, and Kick questionnaire    Fear of Current or Ex-Partner: No    Emotionally Abused: No    Physically Abused:  No    Sexually Abused: No    ALLERGIES:    Allergies  Allergen Reactions   Penicillins Shortness Of Breath    CURRENT MEDICATIONS:    No current outpatient medications on file.   No current facility-administered medications for this visit.    REVIEW OF SYSTEMS:   [X]  denotes positive finding, [ ]  denotes negative finding Cardiac  Comments:  Chest pain or chest pressure: ***   Shortness of breath upon exertion:    Short of breath when lying flat:    Irregular heart rhythm:        Vascular    Pain in calf, thigh, or hip brought on by ambulation:    Pain in feet at night that wakes you up from your sleep:     Blood clot in your veins:    Leg swelling:         Pulmonary    Oxygen at home:    Productive cough:      Wheezing:         Neurologic    Sudden weakness in arms or legs:     Sudden numbness in arms or legs:     Sudden onset of difficulty speaking or slurred speech:    Temporary loss of vision in one eye:     Problems with dizziness:         Gastrointestinal    Blood in stool:      Vomited blood:         Genitourinary    Burning when urinating:     Blood in urine:        Psychiatric    Major depression:         Hematologic    Bleeding problems:    Problems with blood clotting too easily:        Skin    Rashes or ulcers:        Constitutional    Fever or chills:     PHYSICAL EXAM:   There were no vitals filed for this visit.  GENERAL: The patient is a well-nourished male, in no acute distress. The vital signs are documented above. CARDIAC: There is a regular rate and rhythm.  VASCULAR: *** PULMONARY: Nonlabored respirations ABDOMEN: Soft and non-tender with normal pitched bowel sounds.  MUSCULOSKELETAL: There are no major deformities or cyanosis. NEUROLOGIC: No focal weakness or paresthesias are detected. SKIN: There are no ulcers or rashes noted. PSYCHIATRIC: The patient has a normal affect.  STUDIES:   ***  ASSESSMENT and PLAN   ***   Marti Slates, MD, FACS [facility] [phone] Pager [phone]

## 2023-12-27 ENCOUNTER — Telehealth: Payer: Self-pay

## 2023-12-27 LAB — COMPREHENSIVE METABOLIC PANEL WITH GFR
ALT: 8 IU/L (ref 0–44)
AST: 8 IU/L (ref 0–40)
Albumin: 3.6 g/dL — ABNORMAL LOW (ref 3.8–4.9)
Alkaline Phosphatase: 120 IU/L (ref 44–121)
BUN/Creatinine Ratio: 10 (ref 9–20)
BUN: 8 mg/dL (ref 6–24)
Bilirubin Total: 0.3 mg/dL (ref 0.0–1.2)
CO2: 23 mmol/L (ref 20–29)
Calcium: 9.1 mg/dL (ref 8.7–10.2)
Chloride: 97 mmol/L (ref 96–106)
Creatinine, Ser: 0.84 mg/dL (ref 0.76–1.27)
Globulin, Total: 3.2 g/dL (ref 1.5–4.5)
Glucose: 287 mg/dL — ABNORMAL HIGH (ref 70–99)
Potassium: 4.6 mmol/L (ref 3.5–5.2)
Sodium: 137 mmol/L (ref 134–144)
Total Protein: 6.8 g/dL (ref 6.0–8.5)
eGFR: 104 mL/min/{1.73_m2} (ref >59)

## 2023-12-27 LAB — LIPID PANEL
Chol/HDL Ratio: 4.1 ratio (ref 0.0–5.0)
Cholesterol, Total: 110 mg/dL (ref 100–199)
HDL: 27 mg/dL — ABNORMAL LOW (ref >39)
LDL Chol Calc (NIH): 61 mg/dL (ref 0–99)
Triglycerides: 119 mg/dL (ref 0–149)
VLDL Cholesterol Cal: 22 mg/dL (ref 5–40)

## 2023-12-27 LAB — CBC WITH DIFFERENTIAL/PLATELET
Basophils Absolute: 0 10*3/uL (ref 0.0–0.2)
Basos: 1 %
EOS (ABSOLUTE): 0.1 10*3/uL (ref 0.0–0.4)
Eos: 1 %
Hematocrit: 48.4 % (ref 37.5–51.0)
Hemoglobin: 15 g/dL (ref 13.0–17.7)
Immature Grans (Abs): 0 10*3/uL (ref 0.0–0.1)
Immature Granulocytes: 0 %
Lymphocytes Absolute: 1.4 10*3/uL (ref 0.7–3.1)
Lymphs: 16 %
MCH: 27.3 pg (ref 26.6–33.0)
MCHC: 31 g/dL — ABNORMAL LOW (ref 31.5–35.7)
MCV: 88 fL (ref 79–97)
Monocytes Absolute: 1 10*3/uL — ABNORMAL HIGH (ref 0.1–0.9)
Monocytes: 12 %
Neutrophils Absolute: 6.1 10*3/uL (ref 1.4–7.0)
Neutrophils: 70 %
Platelets: 298 10*3/uL (ref 150–450)
RBC: 5.5 x10E6/uL (ref 4.14–5.80)
RDW: 11.9 % (ref 11.6–15.4)
WBC: 8.7 10*3/uL (ref 3.4–10.8)

## 2023-12-27 LAB — MICROALBUMIN / CREATININE URINE RATIO
Creatinine, Urine: 212.7 mg/dL
Microalb/Creat Ratio: 15 mg/g{creat} (ref 0–29)
Microalbumin, Urine: 30.9 ug/mL

## 2023-12-27 LAB — SEDIMENTATION RATE: Sed Rate: 23 mm/h (ref 0–30)

## 2023-12-27 LAB — C-REACTIVE PROTEIN: CRP: 180 mg/L — ABNORMAL HIGH (ref 0–10)

## 2023-12-27 LAB — HEMOGLOBIN A1C
Est. average glucose Bld gHb Est-mCnc: 275 mg/dL
Hgb A1c MFr Bld: 11.2 % — ABNORMAL HIGH (ref 4.8–5.6)

## 2023-12-27 NOTE — Telephone Encounter (Signed)
 Ann from [facility] called to verify that Dr.Sirivol is PCP of patient and would sign home health orders. Verbalized Provider is PCP of patient and orders can be sent to provider. Left voicemail to call back with any more questions

## 2023-12-29 DIAGNOSIS — Z1331 Encounter for screening for depression: Secondary | ICD-10-CM | POA: Diagnosis not present

## 2023-12-29 DIAGNOSIS — S98132A Complete traumatic amputation of one left lesser toe, initial encounter: Secondary | ICD-10-CM | POA: Diagnosis not present

## 2023-12-29 DIAGNOSIS — I739 Peripheral vascular disease, unspecified: Secondary | ICD-10-CM | POA: Diagnosis not present

## 2023-12-29 DIAGNOSIS — E1165 Type 2 diabetes mellitus with hyperglycemia: Secondary | ICD-10-CM | POA: Diagnosis not present

## 2023-12-29 DIAGNOSIS — Z1330 Encounter for screening examination for mental health and behavioral disorders, unspecified: Secondary | ICD-10-CM | POA: Diagnosis not present

## 2024-01-03 ENCOUNTER — Other Ambulatory Visit: Payer: Self-pay

## 2024-01-03 ENCOUNTER — Ambulatory Visit: Attending: Vascular Surgery | Admitting: Vascular Surgery

## 2024-01-03 ENCOUNTER — Encounter: Payer: Self-pay | Admitting: Vascular Surgery

## 2024-01-03 VITALS — BP 137/89 | HR 99 | Temp 98.0°F | Resp 20 | Ht 70.0 in | Wt 197.8 lb

## 2024-01-03 DIAGNOSIS — I70222 Atherosclerosis of native arteries of extremities with rest pain, left leg: Secondary | ICD-10-CM | POA: Diagnosis not present

## 2024-01-03 NOTE — Progress Notes (Signed)
 Patient name: Clayton Everett MRN: [id number] DOB: 1971/02/15 Sex: male  REASON FOR CONSULT: Left foot wound   HPI: Terion Arrazola is a 53 y.o. male, with history diabetes, tobacco abuse, hypertension that presents for evaluation of gangrene to the left 1st and 2nd toes.  This started about 3 months ago.  Turns out he was hospitalized at [facility] recently and he underwent left 1st and 2nd toe amputations by Dr. Lois Rink with podiatry on 12/25/2023.  Pictures show that he now has necrotic changes along the skin edges from the toe amputations.  States no prior vascular interventions.     Past Medical History:  Diagnosis Date   Asthma    Benign essential hypertension 06/30/2020   Chest pain 11/07/2020   Cocaine abuse in remission (HCC) 08/07/2020   Diabetes mellitus, type 2 (HCC) 06/30/2020   Marijuana abuse, continuous 08/07/2020   Mixed hyperlipidemia 06/30/2020   Obesity, diabetes, and hypertension syndrome (HCC) 09/08/2020   Peripheral vascular disease (HCC)     Past Surgical History:  Procedure Laterality Date   none      Family History  Problem Relation Age of Onset   Congestive Heart Failure Mother    Diabetes type II Other    COPD Other    Hypertension Other    Congestive Heart Failure Other    Diabetes Father     SOCIAL HISTORY: Social History   Socioeconomic History   Marital status: Legally Separated    Spouse name: Not on file   Number of children: Not on file   Years of education: Not on file   Highest education level: Not on file  Occupational History   Occupation: Designer, television/film set  Tobacco Use   Smoking status: Some Days    Current packs/day: 1.50    Types: Cigarettes   Smokeless tobacco: Never  Vaping Use   Vaping status: Never Used  Substance and Sexual Activity   Alcohol use: Yes    Alcohol/week: 1.0 standard drink of alcohol    Types: 1 Cans of beer per week    Comment: weekly    Drug use: Yes    Frequency: 1.0 times per week    Types:  Marijuana   Sexual activity: Yes    Partners: Female  Other Topics Concern   Not on file  Social History Narrative   Not on file   Social Drivers of Health   Financial Resource Strain: Medium Risk (12/22/2023)   Overall Financial Resource Strain (CARDIA)    Difficulty of Paying Living Expenses: Somewhat hard  Food Insecurity: Medium Risk (12/29/2023)   Received from [facility]   Hunger Vital Sign    Within the past 12 months, you worried that your food would run out before you got money to buy more: Sometimes true    Within the past 12 months, the food you bought just didn't last and you didn't have money to get more. : Never true  Transportation Needs: Unmet Transportation Needs (12/29/2023)   Received from [employer]    In the past 12 months, has lack of reliable transportation kept you from medical appointments, meetings, work or from getting things needed for daily living? : Yes  Physical Activity: Inactive (12/22/2023)   Exercise Vital Sign    Days of Exercise per Week: 0 days    Minutes of Exercise per Session: 0 min  Stress: No Stress Concern Present (12/22/2023)   [employer] of Occupational Health - Occupational Stress Questionnaire  Feeling of Stress : Only a little  Social Connections: Socially Isolated (12/22/2023)   Social Connection and Isolation Panel    Frequency of Communication with Friends and Family: Twice a week    Frequency of Social Gatherings with Friends and Family: Never    Attends Religious Services: Never    Database administrator or Organizations: No    Attends Banker Meetings: Never    Marital Status: Married  Catering manager Violence: Not At Risk (12/22/2023)   Humiliation, Afraid, Rape, and Kick questionnaire    Fear of Current or Ex-Partner: No    Emotionally Abused: No    Physically Abused: No    Sexually Abused: No    Allergies  Allergen Reactions   Penicillins Shortness Of Breath    Current  Outpatient Medications  Medication Sig Dispense Refill   amLODipine (NORVASC) 10 MG tablet Take 10 mg by mouth.     clopidogrel (PLAVIX) 75 MG tablet Take 75 mg by mouth.     gabapentin (NEURONTIN) 100 MG capsule Take 100 mg by mouth.     gabapentin (NEURONTIN) 300 MG capsule Take by mouth.     levofloxacin (LEVAQUIN) 750 MG tablet Take 750 mg by mouth.     linezolid (ZYVOX) 600 MG tablet Take 600 mg by mouth.     lisinopril  (ZESTRIL ) 20 MG tablet Take 20 mg by mouth.     metFORMIN  (GLUCOPHAGE ) 500 MG tablet Take 500 mg by mouth.     Oxycodone HCl 10 MG TABS Take by mouth.     rosuvastatin (CRESTOR) 20 MG tablet Take 20 mg by mouth.     senna-docusate (SENOKOT-S) 8.6-50 MG tablet Take 1 tablet by mouth daily.     terbinafine (LAMISIL) 1 % cream Apply 1 Application topically.     aspirin 81 MG chewable tablet Chew 81 mg by mouth.     oxyCODONE (OXY IR/ROXICODONE) 5 MG immediate release tablet Take 5 mg by mouth. (Patient not taking: Reported on 01/03/2024)     No current facility-administered medications for this visit.    REVIEW OF SYSTEMS:  [X]  denotes positive finding, [ ]  denotes negative finding Cardiac  Comments:  Chest pain or chest pressure:    Shortness of breath upon exertion:    Short of breath when lying flat:    Irregular heart rhythm:        Vascular    Pain in calf, thigh, or hip brought on by ambulation:    Pain in feet at night that wakes you up from your sleep:     Blood clot in your veins:    Leg swelling:         Pulmonary    Oxygen at home:    Productive cough:     Wheezing:         Neurologic    Sudden weakness in arms or legs:     Sudden numbness in arms or legs:     Sudden onset of difficulty speaking or slurred speech:    Temporary loss of vision in one eye:     Problems with dizziness:         Gastrointestinal    Blood in stool:     Vomited blood:         Genitourinary    Burning when urinating:     Blood in urine:        Psychiatric     Major depression:  Hematologic    Bleeding problems:    Problems with blood clotting too easily:        Skin    Rashes or ulcers:        Constitutional    Fever or chills:      PHYSICAL EXAM: Vitals:   01/03/24 1339  BP: 137/89  Pulse: 99  Resp: 20  Temp: 98 F (36.7 C)  TempSrc: Temporal  SpO2: 98%  Weight: 197 lb 12.8 oz (89.7 kg)  Height: 5' 10 (1.778 m)    GENERAL: The patient is a well-nourished male, in no acute distress. The vital signs are documented above. CARDIAC: There is a regular rate and rhythm.  VASCULAR:  Bilateral common femorals palpable No palpable left pedal pulses with ischemic changes to the left 1st and 2nd toe amputations as pictured PULMONARY: No respiratory distress ABDOMEN: Soft and non-tender  MUSCULOSKELETAL: There are no major deformities or cyanosis. NEUROLOGIC: No focal weakness or paresthesias are detected. PSYCHIATRIC: The patient has a normal affect.       DATA:   ABIs 12/23/2023 are 1.14 on the right triphasic and 0.43 on the left biphasic  Assessment/Plan:  53 y.o. male, with history diabetes, tobacco abuse, hypertension that presents for evaluation of gangrene to the left 1st and 2nd toes.  This started about 3 months ago.  Turns out he was hospitalized at [facility] recently and he underwent left 1st and 2nd toe amputations by Dr. Lois Rink with podiatry on 12/25/2023.  I discussed that his ABIs on the left shows severe PAD.  Presentation consistent with's critical limb ischemia with tissue loss.  The 1st and 2nd toe amputations sites are necrotic appearing as pictured above.  I have urgently added him on Thursday for a angiogram of his left lower extremity with possible intervention.  I do not feel popliteal pulses so I suspect he has SFA popliteal and/or tibial disease and may involve angioplasty and/or stenting.  May ultimately require surgical bypass pending our findings and if he has multi-level occlusive disase.   Risk benefits discussed.  High risk for limb loss as we discussed today.   [name], MD [facility] Specialists of [place] Office: [phone]

## 2024-01-05 ENCOUNTER — Ambulatory Visit

## 2024-01-05 ENCOUNTER — Encounter (HOSPITAL_COMMUNITY)
Admission: RE | Disposition: A | Discharge status: Home / Self Care | Payer: Self-pay | Source: Home / Self Care | Attending: Vascular Surgery

## 2024-01-05 ENCOUNTER — Encounter (HOSPITAL_COMMUNITY): Payer: Self-pay | Admitting: Vascular Surgery

## 2024-01-05 ENCOUNTER — Other Ambulatory Visit: Payer: Self-pay

## 2024-01-05 ENCOUNTER — Ambulatory Visit (HOSPITAL_COMMUNITY)
Admission: RE | Admit: 2024-01-05 | Discharge: 2024-01-05 | Disposition: A | Discharge status: Home / Self Care | Attending: Vascular Surgery | Admitting: Vascular Surgery

## 2024-01-05 DIAGNOSIS — I7092 Chronic total occlusion of artery of the extremities: Secondary | ICD-10-CM | POA: Diagnosis not present

## 2024-01-05 DIAGNOSIS — Z89422 Acquired absence of other left toe(s): Secondary | ICD-10-CM

## 2024-01-05 DIAGNOSIS — Z7984 Long term (current) use of oral hypoglycemic drugs: Secondary | ICD-10-CM | POA: Diagnosis not present

## 2024-01-05 DIAGNOSIS — F17210 Nicotine dependence, cigarettes, uncomplicated: Secondary | ICD-10-CM | POA: Insufficient documentation

## 2024-01-05 DIAGNOSIS — T8789 Other complications of amputation stump: Secondary | ICD-10-CM

## 2024-01-05 DIAGNOSIS — E1151 Type 2 diabetes mellitus with diabetic peripheral angiopathy without gangrene: Secondary | ICD-10-CM | POA: Diagnosis not present

## 2024-01-05 DIAGNOSIS — Z89412 Acquired absence of left great toe: Secondary | ICD-10-CM

## 2024-01-05 DIAGNOSIS — E11621 Type 2 diabetes mellitus with foot ulcer: Secondary | ICD-10-CM | POA: Diagnosis not present

## 2024-01-05 DIAGNOSIS — Z5986 Financial insecurity: Secondary | ICD-10-CM | POA: Insufficient documentation

## 2024-01-05 DIAGNOSIS — I70222 Atherosclerosis of native arteries of extremities with rest pain, left leg: Secondary | ICD-10-CM

## 2024-01-05 DIAGNOSIS — I70245 Atherosclerosis of native arteries of left leg with ulceration of other part of foot: Secondary | ICD-10-CM | POA: Insufficient documentation

## 2024-01-05 DIAGNOSIS — L97521 Non-pressure chronic ulcer of other part of left foot limited to breakdown of skin: Secondary | ICD-10-CM | POA: Insufficient documentation

## 2024-01-05 HISTORY — PX: ABDOMINAL AORTOGRAM: CATH118222

## 2024-01-05 HISTORY — PX: LOWER EXTREMITY INTERVENTION: CATH118252

## 2024-01-05 HISTORY — PX: LOWER EXTREMITY ANGIOGRAPHY: CATH118251

## 2024-01-05 LAB — POCT I-STAT, CHEM 8
BUN: 10 mg/dL (ref 6–20)
Calcium, Ion: 1.06 mmol/L — ABNORMAL LOW (ref 1.15–1.40)
Chloride: 99 mmol/L (ref 98–111)
Creatinine, Ser: 0.7 mg/dL (ref 0.61–1.24)
Glucose, Bld: 186 mg/dL — ABNORMAL HIGH (ref 70–99)
HCT: 39 % (ref 39.0–52.0)
Hemoglobin: 13.3 g/dL (ref 13.0–17.0)
Potassium: 4.6 mmol/L (ref 3.5–5.1)
Sodium: 134 mmol/L — ABNORMAL LOW (ref 135–145)
TCO2: 27 mmol/L (ref 22–32)

## 2024-01-05 SURGERY — ABDOMINAL AORTOGRAM
Anesthesia: LOCAL

## 2024-01-05 MED ORDER — LIDOCAINE HCL (PF) 1 % IJ SOLN
INTRAMUSCULAR | Status: DC | PRN
  Administered 2024-01-05 (×2): 15 mL

## 2024-01-05 MED ORDER — ACETAMINOPHEN 325 MG PO TABS: 650.0000 mg | ORAL_TABLET | ORAL | Status: DC | PRN

## 2024-01-05 MED ORDER — ONDANSETRON HCL 4 MG/2ML IJ SOLN: 4.0000 mg | Freq: Four times a day (QID) | INTRAMUSCULAR | Status: DC | PRN

## 2024-01-05 MED ORDER — FENTANYL CITRATE (PF) 100 MCG/2ML IJ SOLN
INTRAMUSCULAR | Status: DC | PRN
Start: 2024-01-05 — End: 2024-01-05
  Administered 2024-01-05: 25 ug via INTRAVENOUS

## 2024-01-05 MED ORDER — IODIXANOL 320 MG/ML IV SOLN
INTRAVENOUS | Status: DC | PRN
  Administered 2024-01-05: 80 mL

## 2024-01-05 MED ORDER — HEPARIN (PORCINE) IN NACL 1000-0.9 UT/500ML-% IV SOLN
INTRAVENOUS | Status: DC | PRN
  Administered 2024-01-05 (×2): 500 mL

## 2024-01-05 MED ORDER — HEPARIN SODIUM (PORCINE) 1000 UNIT/ML IJ SOLN
INTRAMUSCULAR | Status: DC | PRN
  Administered 2024-01-05: 9000 [IU] via INTRAVENOUS

## 2024-01-05 MED ORDER — SODIUM CHLORIDE 0.9 % IV SOLN: INTRAVENOUS | Status: DC

## 2024-01-05 MED ORDER — HYDRALAZINE HCL 20 MG/ML IJ SOLN: 5.0000 mg | INTRAMUSCULAR | Status: DC | PRN

## 2024-01-05 MED ORDER — LIDOCAINE HCL (PF) 1 % IJ SOLN
INTRAMUSCULAR | Status: AC
  Filled 2024-01-05: qty 30

## 2024-01-05 MED ORDER — HEPARIN SODIUM (PORCINE) 1000 UNIT/ML IJ SOLN
INTRAMUSCULAR | Status: AC
Start: 2024-01-05 — End: 2024-01-05
  Filled 2024-01-05: qty 10

## 2024-01-05 MED ORDER — SODIUM CHLORIDE 0.9 % IV SOLN: 250.0000 mL | INTRAVENOUS | Status: DC | PRN

## 2024-01-05 MED ORDER — MIDAZOLAM HCL 2 MG/2ML IJ SOLN
INTRAMUSCULAR | Status: DC | PRN
  Administered 2024-01-05: 1 mg via INTRAVENOUS

## 2024-01-05 MED ORDER — SODIUM CHLORIDE 0.9% FLUSH: 3.0000 mL | Freq: Two times a day (BID) | INTRAVENOUS | Status: DC

## 2024-01-05 MED ORDER — SODIUM CHLORIDE 0.9% FLUSH: 3.0000 mL | INTRAVENOUS | Status: DC | PRN

## 2024-01-05 MED ORDER — OXYCODONE HCL 5 MG PO TABS: 5.0000 mg | ORAL_TABLET | ORAL | Status: DC | PRN

## 2024-01-05 MED ORDER — LABETALOL HCL 5 MG/ML IV SOLN: 10.0000 mg | INTRAVENOUS | Status: DC | PRN

## 2024-01-05 MED ORDER — FENTANYL CITRATE (PF) 100 MCG/2ML IJ SOLN
INTRAMUSCULAR | Status: AC
  Filled 2024-01-05: qty 2

## 2024-01-05 MED ORDER — MIDAZOLAM HCL 2 MG/2ML IJ SOLN
INTRAMUSCULAR | Status: AC
Start: 2024-01-05 — End: 2024-01-05
  Filled 2024-01-05: qty 2

## 2024-01-05 SURGICAL SUPPLY — 18 items
BALLOON MUSTANG 6X100X135 (BALLOONS) IMPLANT
CATH OMNI FLUSH 5F 65CM (CATHETERS) IMPLANT
CATH QUICKCROSS SUPP .035X90CM (MICROCATHETER) IMPLANT
DEVICE CLOSURE MYNXGRIP 6/7F (Vascular Products) IMPLANT
GLIDEWIRE ADV .035X260CM (WIRE) IMPLANT
KIT ENCORE 26 ADVANTAGE (KITS) IMPLANT
KIT MICROPUNCTURE NIT STIFF (SHEATH) IMPLANT
KIT PV (KITS) ×3 IMPLANT
SET ATX-X65L (MISCELLANEOUS) IMPLANT
SHEATH CATAPULT 6F 45 MP (SHEATH) IMPLANT
SHEATH PINNACLE 5F 10CM (SHEATH) IMPLANT
SHEATH PINNACLE 6F 10CM (SHEATH) IMPLANT
SHEATH PROBE COVER 6X72 (BAG) IMPLANT
STENT ELUVIA 7X100X130 (Permanent Stent) IMPLANT
SYR MEDRAD MARK 7 150ML (SYRINGE) ×3 IMPLANT
TRANSDUCER W/STOPCOCK (MISCELLANEOUS) ×3 IMPLANT
TRAY PV CATH (CUSTOM PROCEDURE TRAY) ×3 IMPLANT
WIRE BENTSON .035X145CM (WIRE) IMPLANT

## 2024-01-05 NOTE — H&P (Signed)
 History and Physical Interval Note:  01/05/2024 10:32 AM  Clayton Everett  has presented today for surgery, with the diagnosis of ischemia left leg with wound.  The various methods of treatment have been discussed with the patient and family. After consideration of risks, benefits and other options for treatment, the patient has consented to  Procedure(s): ABDOMINAL AORTOGRAM (N/A) Lower Extremity Angiography (N/A) ENDOVASCULAR REPAIR OF POPLITEAL ARTERY ANEURYSM (N/A) PERIPHERAL VASCULAR ATHERECTOMY (N/A) as a surgical intervention.  The patient's history has been reviewed, patient examined, no change in status, stable for surgery.  I have reviewed the patient's chart and labs.  Questions were answered to the patient's satisfaction.    CLI left lower extremity with non-healing toe amputations.  Clayton Everett     Patient name: Clayton Everett        MRN: [id number]        DOB: 1970-12-20          Sex: male   REASON FOR CONSULT: Left foot wound    HPI: Clayton Everett is a 53 y.o. male, with history diabetes, tobacco abuse, hypertension that presents for evaluation of gangrene to the left 1st and 2nd toes.  This started about 3 months ago.  Turns out he was hospitalized at [facility] recently and he underwent left 1st and 2nd toe amputations by Dr. Lois Rink with podiatry on 12/25/2023.  Pictures show that he now has necrotic changes along the skin edges from the toe amputations.  States no prior vascular interventions.             Past Medical History:  Diagnosis Date   Asthma     Benign essential hypertension 06/30/2020   Chest pain 11/07/2020   Cocaine abuse in remission (HCC) 08/07/2020   Diabetes mellitus, type 2 (HCC) 06/30/2020   Marijuana abuse, continuous 08/07/2020   Mixed hyperlipidemia 06/30/2020   Obesity, diabetes, and hypertension syndrome (HCC) 09/08/2020   Peripheral vascular disease (HCC)                 Past Surgical History:  Procedure Laterality Date    none                   Family History  Problem Relation Age of Onset   Congestive Heart Failure Mother     Diabetes type II Other     COPD Other     Hypertension Other     Congestive Heart Failure Other     Diabetes Father            SOCIAL HISTORY: Social History         Socioeconomic History   Marital status: Legally Separated      Spouse name: Not on file   Number of children: Not on file   Years of education: Not on file   Highest education level: Not on file  Occupational History   Occupation: Designer, television/film set  Tobacco Use   Smoking status: Some Days      Current packs/day: 1.50      Types: Cigarettes   Smokeless tobacco: Never  Vaping Use   Vaping status: Never Used  Substance and Sexual Activity   Alcohol use: Yes      Alcohol/week: 1.0 standard drink of alcohol      Types: 1 Cans of beer per week      Comment: weekly    Drug use: Yes      Frequency: 1.0 times per week      Types:  Marijuana   Sexual activity: Yes      Partners: Female  Other Topics Concern   Not on file  Social History Narrative   Not on file    Social Drivers of Health        Financial Resource Strain: Medium Risk (12/22/2023)    Overall Financial Resource Strain (CARDIA)     Difficulty of Paying Living Expenses: Somewhat hard  Food Insecurity: Medium Risk (12/29/2023)    Received from [facility]    Hunger Vital Sign     Within the past 12 months, you worried that your food would run out before you got money to buy more: Sometimes true     Within the past 12 months, the food you bought just didn't last and you didn't have money to get more. : Never true  Transportation Needs: Unmet Transportation Needs (12/29/2023)    Received from [employer]     In the past 12 months, has lack of reliable transportation kept you from medical appointments, meetings, work or from getting things needed for daily living? : Yes  Physical Activity: Inactive (12/22/2023)    Exercise  Vital Sign     Days of Exercise per Week: 0 days     Minutes of Exercise per Session: 0 min  Stress: No Stress Concern Present (12/22/2023)    [employer] of Occupational Health - Occupational Stress Questionnaire     Feeling of Stress : Only a little  Social Connections: Socially Isolated (12/22/2023)    Social Connection and Isolation Panel     Frequency of Communication with Friends and Family: Twice a week     Frequency of Social Gatherings with Friends and Family: Never     Attends Religious Services: Never     Database administrator or Organizations: No     Attends Banker Meetings: Never     Marital Status: Married  Catering manager Violence: Not At Risk (12/22/2023)    Humiliation, Afraid, Rape, and Kick questionnaire     Fear of Current or Ex-Partner: No     Emotionally Abused: No     Physically Abused: No     Sexually Abused: No      Allergies      Allergies  Allergen Reactions   Penicillins Shortness Of Breath              Current Outpatient Medications  Medication Sig Dispense Refill   amLODipine (NORVASC) 10 MG tablet Take 10 mg by mouth.       clopidogrel (PLAVIX) 75 MG tablet Take 75 mg by mouth.       gabapentin (NEURONTIN) 100 MG capsule Take 100 mg by mouth.       gabapentin (NEURONTIN) 300 MG capsule Take by mouth.       levofloxacin (LEVAQUIN) 750 MG tablet Take 750 mg by mouth.       linezolid (ZYVOX) 600 MG tablet Take 600 mg by mouth.       lisinopril  (ZESTRIL ) 20 MG tablet Take 20 mg by mouth.       metFORMIN  (GLUCOPHAGE ) 500 MG tablet Take 500 mg by mouth.       Oxycodone HCl 10 MG TABS Take by mouth.       rosuvastatin (CRESTOR) 20 MG tablet Take 20 mg by mouth.       senna-docusate (SENOKOT-S) 8.6-50 MG tablet Take 1 tablet by mouth daily.       terbinafine (LAMISIL) 1 %  cream Apply 1 Application topically.       aspirin 81 MG chewable tablet Chew 81 mg by mouth.       oxyCODONE (OXY IR/ROXICODONE) 5 MG immediate release tablet  Take 5 mg by mouth. (Patient not taking: Reported on 01/03/2024)          No current facility-administered medications for this visit.        REVIEW OF SYSTEMS:  [X]  denotes positive finding, [ ]  denotes negative finding Cardiac   Comments:  Chest pain or chest pressure:      Shortness of breath upon exertion:      Short of breath when lying flat:      Irregular heart rhythm:             Vascular      Pain in calf, thigh, or hip brought on by ambulation:      Pain in feet at night that wakes you up from your sleep:       Blood clot in your veins:      Leg swelling:              Pulmonary      Oxygen at home:      Productive cough:       Wheezing:              Neurologic      Sudden weakness in arms or legs:       Sudden numbness in arms or legs:       Sudden onset of difficulty speaking or slurred speech:      Temporary loss of vision in one eye:       Problems with dizziness:              Gastrointestinal      Blood in stool:       Vomited blood:              Genitourinary      Burning when urinating:       Blood in urine:             Psychiatric      Major depression:              Hematologic      Bleeding problems:      Problems with blood clotting too easily:             Skin      Rashes or ulcers:             Constitutional      Fever or chills:          PHYSICAL EXAM:    Vitals:    01/03/24 1339  BP: 137/89  Pulse: 99  Resp: 20  Temp: 98 F (36.7 C)  TempSrc: Temporal  SpO2: 98%  Weight: 197 lb 12.8 oz (89.7 kg)  Height: 5' 10 (1.778 m)      GENERAL: The patient is a well-nourished male, in no acute distress. The vital signs are documented above. CARDIAC: There is a regular rate and rhythm.  VASCULAR:  Bilateral common femorals palpable No palpable left pedal pulses with ischemic changes to the left 1st and 2nd toe amputations as pictured PULMONARY: No respiratory distress ABDOMEN: Soft and non-tender  MUSCULOSKELETAL: There are no  major deformities or cyanosis. NEUROLOGIC: No focal weakness or paresthesias are detected. PSYCHIATRIC: The patient has a normal affect.  DATA:    ABIs 12/23/2023 are 1.14 on the right triphasic and 0.43 on the left biphasic   Assessment/Plan:   53 y.o. male, with history diabetes, tobacco abuse, hypertension that presents for evaluation of gangrene to the left 1st and 2nd toes.  This started about 3 months ago.  Turns out he was hospitalized at [facility] recently and he underwent left 1st and 2nd toe amputations by Dr. Lois Rink with podiatry on 12/25/2023.  I discussed that his ABIs on the left shows severe PAD.  Presentation consistent with's critical limb ischemia with tissue loss.  The 1st and 2nd toe amputations sites are necrotic appearing as pictured above.  I have urgently added him on Thursday for a angiogram of his left lower extremity with possible intervention.  I do not feel popliteal pulses so I suspect he has SFA popliteal and/or tibial disease and may involve angioplasty and/or stenting.  May ultimately require surgical bypass pending our findings and if he has multi-level occlusive disase.  Risk benefits discussed.  High risk for limb loss as we discussed today.     Clayton Hensen, MD [facility] of [place] Office: [phone]

## 2024-01-05 NOTE — Discharge Instructions (Signed)
 Femoral Site Care This sheet gives you information about how to care for yourself after your procedure. Your health care provider may also give you more specific instructions. If you have problems or questions, contact your health care provider. What can I expect after the procedure?  After the procedure, it is common to have: Bruising that usually fades within 1-2 weeks. Tenderness at the site. Follow these instructions at home: Wound care Follow instructions from your health care provider about how to take care of your insertion site. Make sure you: Wash your hands with soap and water before you change your bandage (dressing). If soap and water are not available, use hand sanitizer. Remove your dressing as told by your health care provider. In 24 hours Do not take baths, swim, or use a hot tub for 5 days. You may shower 24 hours after the procedure or as told by your health care provider. Gently wash the site with plain soap and water. Pat the area dry with a clean towel. Do not rub the site. This may cause bleeding. Do not apply powder or lotion to the site. Keep the site clean and dry. Check your femoral site every day for signs of infection. Check for: Redness, swelling, or pain. Fluid or blood. Warmth. Pus or a bad smell. Activity For the first 2-3 days after your procedure, or as long as directed: Avoid climbing stairs as much as possible. Do not squat. Do not lift anything that is heavier than 10 lb (4.5 kg), or the limit that you are told, until your health care provider says that it is safe. For 5 days Rest as directed. Avoid sitting for a long time without moving. Get up to take short walks every 1-2 hours. Do not drive for 24 hours if you were given a medicine to help you relax (sedative). General instructions Take over-the-counter and prescription medicines only as told by your health care provider. Keep all follow-up visits as told by your health care provider. This is  important. Contact a health care provider if you have: A fever or chills. You have redness, swelling, or pain around your insertion site. Get help right away if: The catheter insertion area swells very fast. You pass out. You suddenly start to sweat or your skin gets clammy. The catheter insertion area is bleeding, and the bleeding does not stop when you hold steady pressure on the area. The area near or just beyond the catheter insertion site becomes pale, cool, tingly, or numb. These symptoms may represent a serious problem that is an emergency. Do not wait to see if the symptoms will go away. Get medical help right away. Call your local emergency services (911 in the [place] Do not drive yourself to the hospital. Summary After the procedure, it is common to have bruising that usually fades within 1-2 weeks. Check your femoral site every day for signs of infection. Do not lift anything that is heavier than 10 lb (4.5 kg), or the limit that you are told, until your health care provider says that it is safe. This information is not intended to replace advice given to you by your health care provider. Make sure you discuss any questions you have with your health care provider. Document Revised: 07/18/2017 Document Reviewed: 07/18/2017 Elsevier Patient Education  2020 [employer].

## 2024-01-05 NOTE — Op Note (Signed)
 Patient name: Clayton Everett MRN: [id number] DOB: [birth year] Sex: male  01/05/2024 Pre-operative Diagnosis: Critical limb ischemia left lower extremity with tissue loss and nonhealing toe amputations Post-operative diagnosis:  Same Surgeon:  [name], MD Procedure Performed: 1.  Ultrasound-guided access right common femoral artery 2.  Aortogram with catheter selection of aorta 3.  Left lower extremity arteriogram with catheter selection of the left popliteal artery 4.  Left SFA angioplasty with stent placement (7 mm x 100 mm drug-coated Eluvia postdilated with a 6 mm Mustang) 5.  Mynx closure of the right common femoral artery 6.  30 minutes of monitored moderate conscious sedation time  Indications: 53 year old male with multiple risk factors that was seen with gangrene of his left 1st and 2nd toes now having undergone amputation with podiatry.  His toe amputations are nonhealing.  His ABI was 0.4.  He presents for lower extremity arteriogram with possible invention after risks benefits discussed.  Findings:   Ultrasound-guided access right common femoral artery.  Catheter selection of aorta and aortogram showed widely patent infrarenal aorta with bilateral iliacs.  On the left he had a widely patent common femoral and profunda.  The SFA is widely patent proximally and then he has a short chronic total occlusion in the mid vessel and then reconstitutes above-knee popliteal artery with patent below-knee popliteal artery and widely patent three-vessel runoff.  The left SFA stenosis was crossed antegrade from contralateral groin access.  This was stented with a 7 mm x 100 mm drug-coated Eluvia postdilated with a 6 mm Mustang.  Widely patent stent at completion.  Preserved three-vessel runoff.   Procedure:  The patient was identified in the holding area and taken to room 8.  The patient was then placed supine on the table and prepped and draped in the usual sterile fashion.  A  time out was called.  Patient received Versed and fentanyl for conscious moderate sedation.  Vital signs were monitored including heart rate, respiratory rate, oxygenation and blood pressure.  I was present for all moderate sedation.  Ultrasound was used to evaluate the right common femoral artery.  It was patent .  A digital ultrasound image was acquired.  A micropuncture needle was used to access the right common femoral artery under ultrasound guidance.  An 018 wire was advanced without resistance and a micropuncture sheath was placed.  The 018 wire was removed and a benson wire was placed.  The micropuncture sheath was exchanged for a 5 french sheath.  An omniflush catheter was advanced over the wire to the level of L-1.  An abdominal angiogram was obtained.  Next, using the omniflush catheter and a benson wire, the aortic bifurcation was crossed and the catheter was placed into theleft external iliac artery and left runoff was obtained.  After images we elected for intervention.  The left SFA was selected with a Glidewire advantage and I upsized to a long 6 [place] catapult sheath in the right groin over the aortic bifurcation.  The patient was given 100 units/kg IV heparin.  I then used a quick cross catheter with the guidewire advantage and crossed the left SFA occlusion.  I got my catheter into the popliteal artery above the knee and shot a picture and did confirm I was in the true lumen.  The lesion was then stented with a 7 mm x 100 mm Eluvia in the left SFA.  This was postdilated with a 6 mm Mustang.  Widely patent vessel at completion with  preserved runoff in all three vessels below the knee.  Wires and catheters were removed.  A short 6 French sheath was placed in the right groin.  Mynx closure deployed.  Taken to recovery in stable condition.  Plan: Excellent results after left SFA stenting today.  He now has inline flow and is optimized with three-vessel runoff.  Aspirin statin Plavix.  Will arrange  follow-up in 1 month with non-invasive imaging.    [name], MD [facility] [place] Office: [phone]

## 2024-01-09 DIAGNOSIS — L0390 Cellulitis, unspecified: Secondary | ICD-10-CM | POA: Diagnosis not present

## 2024-01-17 DIAGNOSIS — T8781 Dehiscence of amputation stump: Secondary | ICD-10-CM | POA: Diagnosis not present

## 2024-01-17 DIAGNOSIS — L7622 Postprocedural hemorrhage and hematoma of skin and subcutaneous tissue following other procedure: Secondary | ICD-10-CM | POA: Diagnosis not present

## 2024-01-17 DIAGNOSIS — Z89422 Acquired absence of other left toe(s): Secondary | ICD-10-CM | POA: Diagnosis not present

## 2024-01-17 DIAGNOSIS — Z79891 Long term (current) use of opiate analgesic: Secondary | ICD-10-CM | POA: Diagnosis not present

## 2024-01-17 DIAGNOSIS — T8754 Necrosis of amputation stump, left lower extremity: Secondary | ICD-10-CM | POA: Diagnosis not present

## 2024-01-17 DIAGNOSIS — Y838 Other surgical procedures as the cause of abnormal reaction of the patient, or of later complication, without mention of misadventure at the time of the procedure: Secondary | ICD-10-CM | POA: Diagnosis not present

## 2024-01-17 DIAGNOSIS — Z7902 Long term (current) use of antithrombotics/antiplatelets: Secondary | ICD-10-CM | POA: Diagnosis not present

## 2024-01-17 DIAGNOSIS — L97523 Non-pressure chronic ulcer of other part of left foot with necrosis of muscle: Secondary | ICD-10-CM | POA: Diagnosis not present

## 2024-01-17 DIAGNOSIS — I10 Essential (primary) hypertension: Secondary | ICD-10-CM | POA: Diagnosis not present

## 2024-01-17 DIAGNOSIS — I9589 Other hypotension: Secondary | ICD-10-CM | POA: Diagnosis not present

## 2024-01-17 DIAGNOSIS — E1142 Type 2 diabetes mellitus with diabetic polyneuropathy: Secondary | ICD-10-CM | POA: Diagnosis not present

## 2024-01-17 DIAGNOSIS — Z95828 Presence of other vascular implants and grafts: Secondary | ICD-10-CM | POA: Diagnosis not present

## 2024-01-17 DIAGNOSIS — F17210 Nicotine dependence, cigarettes, uncomplicated: Secondary | ICD-10-CM | POA: Diagnosis not present

## 2024-01-17 DIAGNOSIS — D62 Acute posthemorrhagic anemia: Secondary | ICD-10-CM | POA: Diagnosis not present

## 2024-01-17 DIAGNOSIS — T8131XA Disruption of external operation (surgical) wound, not elsewhere classified, initial encounter: Secondary | ICD-10-CM | POA: Diagnosis not present

## 2024-01-17 DIAGNOSIS — Z79899 Other long term (current) drug therapy: Secondary | ICD-10-CM | POA: Diagnosis not present

## 2024-01-17 DIAGNOSIS — Z7984 Long term (current) use of oral hypoglycemic drugs: Secondary | ICD-10-CM | POA: Diagnosis not present

## 2024-01-17 DIAGNOSIS — E1152 Type 2 diabetes mellitus with diabetic peripheral angiopathy with gangrene: Secondary | ICD-10-CM | POA: Diagnosis not present

## 2024-01-17 DIAGNOSIS — Z880 Allergy status to penicillin: Secondary | ICD-10-CM | POA: Diagnosis not present

## 2024-01-17 DIAGNOSIS — Z7982 Long term (current) use of aspirin: Secondary | ICD-10-CM | POA: Diagnosis not present

## 2024-01-17 DIAGNOSIS — E119 Type 2 diabetes mellitus without complications: Secondary | ICD-10-CM | POA: Diagnosis not present

## 2024-01-17 DIAGNOSIS — I96 Gangrene, not elsewhere classified: Secondary | ICD-10-CM | POA: Diagnosis not present

## 2024-01-17 DIAGNOSIS — E11621 Type 2 diabetes mellitus with foot ulcer: Secondary | ICD-10-CM | POA: Diagnosis not present

## 2024-01-17 DIAGNOSIS — E871 Hypo-osmolality and hyponatremia: Secondary | ICD-10-CM | POA: Diagnosis not present

## 2024-01-17 DIAGNOSIS — M96830 Postprocedural hemorrhage and hematoma of a musculoskeletal structure following a musculoskeletal system procedure: Secondary | ICD-10-CM | POA: Diagnosis not present

## 2024-01-17 DIAGNOSIS — L97506 Non-pressure chronic ulcer of other part of unspecified foot with bone involvement without evidence of necrosis: Secondary | ICD-10-CM | POA: Diagnosis not present

## 2024-01-17 DIAGNOSIS — D7589 Other specified diseases of blood and blood-forming organs: Secondary | ICD-10-CM | POA: Diagnosis not present

## 2024-01-23 DIAGNOSIS — E119 Type 2 diabetes mellitus without complications: Secondary | ICD-10-CM | POA: Diagnosis not present

## 2024-01-23 DIAGNOSIS — E11621 Type 2 diabetes mellitus with foot ulcer: Secondary | ICD-10-CM | POA: Diagnosis not present

## 2024-01-23 DIAGNOSIS — G8918 Other acute postprocedural pain: Secondary | ICD-10-CM | POA: Diagnosis not present

## 2024-01-23 DIAGNOSIS — Z9889 Other specified postprocedural states: Secondary | ICD-10-CM | POA: Diagnosis not present

## 2024-01-23 DIAGNOSIS — T8744 Infection of amputation stump, left lower extremity: Secondary | ICD-10-CM | POA: Diagnosis not present

## 2024-01-23 DIAGNOSIS — L97509 Non-pressure chronic ulcer of other part of unspecified foot with unspecified severity: Secondary | ICD-10-CM | POA: Diagnosis not present

## 2024-01-24 DIAGNOSIS — E119 Type 2 diabetes mellitus without complications: Secondary | ICD-10-CM | POA: Diagnosis not present

## 2024-01-24 DIAGNOSIS — T8744 Infection of amputation stump, left lower extremity: Secondary | ICD-10-CM | POA: Diagnosis not present

## 2024-01-25 DIAGNOSIS — E119 Type 2 diabetes mellitus without complications: Secondary | ICD-10-CM | POA: Diagnosis not present

## 2024-01-25 DIAGNOSIS — T8744 Infection of amputation stump, left lower extremity: Secondary | ICD-10-CM | POA: Diagnosis not present

## 2024-01-26 DIAGNOSIS — L97523 Non-pressure chronic ulcer of other part of left foot with necrosis of muscle: Secondary | ICD-10-CM | POA: Diagnosis not present

## 2024-01-26 DIAGNOSIS — E119 Type 2 diabetes mellitus without complications: Secondary | ICD-10-CM | POA: Diagnosis not present

## 2024-01-26 DIAGNOSIS — E11621 Type 2 diabetes mellitus with foot ulcer: Secondary | ICD-10-CM | POA: Diagnosis not present

## 2024-01-26 DIAGNOSIS — T8744 Infection of amputation stump, left lower extremity: Secondary | ICD-10-CM | POA: Diagnosis not present

## 2024-01-26 DIAGNOSIS — L97509 Non-pressure chronic ulcer of other part of unspecified foot with unspecified severity: Secondary | ICD-10-CM | POA: Diagnosis not present

## 2024-01-27 DIAGNOSIS — T8744 Infection of amputation stump, left lower extremity: Secondary | ICD-10-CM | POA: Diagnosis not present

## 2024-01-27 DIAGNOSIS — E119 Type 2 diabetes mellitus without complications: Secondary | ICD-10-CM | POA: Diagnosis not present

## 2024-01-28 DIAGNOSIS — T8744 Infection of amputation stump, left lower extremity: Secondary | ICD-10-CM | POA: Diagnosis not present

## 2024-01-28 DIAGNOSIS — E119 Type 2 diabetes mellitus without complications: Secondary | ICD-10-CM | POA: Diagnosis not present

## 2024-01-29 DIAGNOSIS — T8744 Infection of amputation stump, left lower extremity: Secondary | ICD-10-CM | POA: Diagnosis not present

## 2024-01-29 DIAGNOSIS — E119 Type 2 diabetes mellitus without complications: Secondary | ICD-10-CM | POA: Diagnosis not present

## 2024-01-30 DIAGNOSIS — E11621 Type 2 diabetes mellitus with foot ulcer: Secondary | ICD-10-CM | POA: Diagnosis not present

## 2024-01-30 DIAGNOSIS — T8744 Infection of amputation stump, left lower extremity: Secondary | ICD-10-CM | POA: Diagnosis not present

## 2024-01-30 DIAGNOSIS — Z89412 Acquired absence of left great toe: Secondary | ICD-10-CM | POA: Diagnosis not present

## 2024-01-30 DIAGNOSIS — L97523 Non-pressure chronic ulcer of other part of left foot with necrosis of muscle: Secondary | ICD-10-CM | POA: Diagnosis not present

## 2024-01-30 DIAGNOSIS — E119 Type 2 diabetes mellitus without complications: Secondary | ICD-10-CM | POA: Diagnosis not present

## 2024-01-30 DIAGNOSIS — Z89422 Acquired absence of other left toe(s): Secondary | ICD-10-CM | POA: Diagnosis not present

## 2024-01-31 DIAGNOSIS — E119 Type 2 diabetes mellitus without complications: Secondary | ICD-10-CM | POA: Diagnosis not present

## 2024-01-31 DIAGNOSIS — T8744 Infection of amputation stump, left lower extremity: Secondary | ICD-10-CM | POA: Diagnosis not present

## 2024-02-01 DIAGNOSIS — T8744 Infection of amputation stump, left lower extremity: Secondary | ICD-10-CM | POA: Diagnosis not present

## 2024-02-01 DIAGNOSIS — E119 Type 2 diabetes mellitus without complications: Secondary | ICD-10-CM | POA: Diagnosis not present

## 2024-02-02 DIAGNOSIS — T8744 Infection of amputation stump, left lower extremity: Secondary | ICD-10-CM | POA: Diagnosis not present

## 2024-02-02 DIAGNOSIS — E119 Type 2 diabetes mellitus without complications: Secondary | ICD-10-CM | POA: Diagnosis not present

## 2024-02-02 DIAGNOSIS — E11621 Type 2 diabetes mellitus with foot ulcer: Secondary | ICD-10-CM | POA: Diagnosis not present

## 2024-02-02 DIAGNOSIS — E1151 Type 2 diabetes mellitus with diabetic peripheral angiopathy without gangrene: Secondary | ICD-10-CM | POA: Diagnosis not present

## 2024-02-02 DIAGNOSIS — L97523 Non-pressure chronic ulcer of other part of left foot with necrosis of muscle: Secondary | ICD-10-CM | POA: Diagnosis not present

## 2024-02-02 DIAGNOSIS — M79672 Pain in left foot: Secondary | ICD-10-CM | POA: Diagnosis not present

## 2024-02-03 DIAGNOSIS — E119 Type 2 diabetes mellitus without complications: Secondary | ICD-10-CM | POA: Diagnosis not present

## 2024-02-03 DIAGNOSIS — T8744 Infection of amputation stump, left lower extremity: Secondary | ICD-10-CM | POA: Diagnosis not present

## 2024-02-04 DIAGNOSIS — E119 Type 2 diabetes mellitus without complications: Secondary | ICD-10-CM | POA: Diagnosis not present

## 2024-02-04 DIAGNOSIS — T8744 Infection of amputation stump, left lower extremity: Secondary | ICD-10-CM | POA: Diagnosis not present

## 2024-02-05 DIAGNOSIS — T8744 Infection of amputation stump, left lower extremity: Secondary | ICD-10-CM | POA: Diagnosis not present

## 2024-02-05 DIAGNOSIS — E119 Type 2 diabetes mellitus without complications: Secondary | ICD-10-CM | POA: Diagnosis not present

## 2024-02-06 ENCOUNTER — Other Ambulatory Visit: Payer: Self-pay

## 2024-02-06 DIAGNOSIS — T8744 Infection of amputation stump, left lower extremity: Secondary | ICD-10-CM | POA: Diagnosis not present

## 2024-02-06 DIAGNOSIS — Z89422 Acquired absence of other left toe(s): Secondary | ICD-10-CM | POA: Diagnosis not present

## 2024-02-06 DIAGNOSIS — I70222 Atherosclerosis of native arteries of extremities with rest pain, left leg: Secondary | ICD-10-CM

## 2024-02-06 DIAGNOSIS — L97523 Non-pressure chronic ulcer of other part of left foot with necrosis of muscle: Secondary | ICD-10-CM | POA: Diagnosis not present

## 2024-02-06 DIAGNOSIS — E119 Type 2 diabetes mellitus without complications: Secondary | ICD-10-CM | POA: Diagnosis not present

## 2024-02-06 DIAGNOSIS — Z89412 Acquired absence of left great toe: Secondary | ICD-10-CM | POA: Diagnosis not present

## 2024-02-06 DIAGNOSIS — E11621 Type 2 diabetes mellitus with foot ulcer: Secondary | ICD-10-CM | POA: Diagnosis not present

## 2024-02-07 DIAGNOSIS — E119 Type 2 diabetes mellitus without complications: Secondary | ICD-10-CM | POA: Diagnosis not present

## 2024-02-07 DIAGNOSIS — T8744 Infection of amputation stump, left lower extremity: Secondary | ICD-10-CM | POA: Diagnosis not present

## 2024-02-08 DIAGNOSIS — E119 Type 2 diabetes mellitus without complications: Secondary | ICD-10-CM | POA: Diagnosis not present

## 2024-02-08 DIAGNOSIS — T8744 Infection of amputation stump, left lower extremity: Secondary | ICD-10-CM | POA: Diagnosis not present

## 2024-02-09 DIAGNOSIS — E11622 Type 2 diabetes mellitus with other skin ulcer: Secondary | ICD-10-CM | POA: Diagnosis not present

## 2024-02-09 DIAGNOSIS — E119 Type 2 diabetes mellitus without complications: Secondary | ICD-10-CM | POA: Diagnosis not present

## 2024-02-09 DIAGNOSIS — Z9889 Other specified postprocedural states: Secondary | ICD-10-CM | POA: Diagnosis not present

## 2024-02-09 DIAGNOSIS — T8744 Infection of amputation stump, left lower extremity: Secondary | ICD-10-CM | POA: Diagnosis not present

## 2024-02-09 DIAGNOSIS — Z89412 Acquired absence of left great toe: Secondary | ICD-10-CM | POA: Diagnosis not present

## 2024-02-09 DIAGNOSIS — L97323 Non-pressure chronic ulcer of left ankle with necrosis of muscle: Secondary | ICD-10-CM | POA: Diagnosis not present

## 2024-02-09 DIAGNOSIS — L0390 Cellulitis, unspecified: Secondary | ICD-10-CM | POA: Diagnosis not present

## 2024-02-10 DIAGNOSIS — E119 Type 2 diabetes mellitus without complications: Secondary | ICD-10-CM | POA: Diagnosis not present

## 2024-02-10 DIAGNOSIS — T8744 Infection of amputation stump, left lower extremity: Secondary | ICD-10-CM | POA: Diagnosis not present

## 2024-02-11 DIAGNOSIS — T8744 Infection of amputation stump, left lower extremity: Secondary | ICD-10-CM | POA: Diagnosis not present

## 2024-02-11 DIAGNOSIS — E119 Type 2 diabetes mellitus without complications: Secondary | ICD-10-CM | POA: Diagnosis not present

## 2024-02-12 DIAGNOSIS — T8744 Infection of amputation stump, left lower extremity: Secondary | ICD-10-CM | POA: Diagnosis not present

## 2024-02-12 DIAGNOSIS — E119 Type 2 diabetes mellitus without complications: Secondary | ICD-10-CM | POA: Diagnosis not present

## 2024-02-13 DIAGNOSIS — I10 Essential (primary) hypertension: Secondary | ICD-10-CM | POA: Diagnosis not present

## 2024-02-13 DIAGNOSIS — D62 Acute posthemorrhagic anemia: Secondary | ICD-10-CM | POA: Diagnosis not present

## 2024-02-13 DIAGNOSIS — Z89412 Acquired absence of left great toe: Secondary | ICD-10-CM | POA: Diagnosis not present

## 2024-02-13 DIAGNOSIS — E1165 Type 2 diabetes mellitus with hyperglycemia: Secondary | ICD-10-CM | POA: Diagnosis not present

## 2024-02-13 DIAGNOSIS — Z89422 Acquired absence of other left toe(s): Secondary | ICD-10-CM | POA: Diagnosis not present

## 2024-02-13 DIAGNOSIS — E11621 Type 2 diabetes mellitus with foot ulcer: Secondary | ICD-10-CM | POA: Diagnosis not present

## 2024-02-13 DIAGNOSIS — L97523 Non-pressure chronic ulcer of other part of left foot with necrosis of muscle: Secondary | ICD-10-CM | POA: Diagnosis not present

## 2024-02-13 DIAGNOSIS — T148XXA Other injury of unspecified body region, initial encounter: Secondary | ICD-10-CM | POA: Diagnosis not present

## 2024-02-13 DIAGNOSIS — E6609 Other obesity due to excess calories: Secondary | ICD-10-CM | POA: Diagnosis not present

## 2024-02-13 DIAGNOSIS — E119 Type 2 diabetes mellitus without complications: Secondary | ICD-10-CM | POA: Diagnosis not present

## 2024-02-13 DIAGNOSIS — E782 Mixed hyperlipidemia: Secondary | ICD-10-CM | POA: Diagnosis not present

## 2024-02-13 DIAGNOSIS — I739 Peripheral vascular disease, unspecified: Secondary | ICD-10-CM | POA: Diagnosis not present

## 2024-02-13 DIAGNOSIS — Z9889 Other specified postprocedural states: Secondary | ICD-10-CM | POA: Diagnosis not present

## 2024-02-13 DIAGNOSIS — Z5181 Encounter for therapeutic drug level monitoring: Secondary | ICD-10-CM | POA: Diagnosis not present

## 2024-02-13 DIAGNOSIS — T8744 Infection of amputation stump, left lower extremity: Secondary | ICD-10-CM | POA: Diagnosis not present

## 2024-02-14 DIAGNOSIS — E119 Type 2 diabetes mellitus without complications: Secondary | ICD-10-CM | POA: Diagnosis not present

## 2024-02-14 DIAGNOSIS — T8744 Infection of amputation stump, left lower extremity: Secondary | ICD-10-CM | POA: Diagnosis not present

## 2024-02-15 DIAGNOSIS — E119 Type 2 diabetes mellitus without complications: Secondary | ICD-10-CM | POA: Diagnosis not present

## 2024-02-15 DIAGNOSIS — T8744 Infection of amputation stump, left lower extremity: Secondary | ICD-10-CM | POA: Diagnosis not present

## 2024-02-16 DIAGNOSIS — Z9889 Other specified postprocedural states: Secondary | ICD-10-CM | POA: Diagnosis not present

## 2024-02-16 DIAGNOSIS — T8744 Infection of amputation stump, left lower extremity: Secondary | ICD-10-CM | POA: Diagnosis not present

## 2024-02-16 DIAGNOSIS — E119 Type 2 diabetes mellitus without complications: Secondary | ICD-10-CM | POA: Diagnosis not present

## 2024-02-16 DIAGNOSIS — L97522 Non-pressure chronic ulcer of other part of left foot with fat layer exposed: Secondary | ICD-10-CM | POA: Diagnosis not present

## 2024-02-16 DIAGNOSIS — Z89412 Acquired absence of left great toe: Secondary | ICD-10-CM | POA: Diagnosis not present

## 2024-02-16 DIAGNOSIS — E11621 Type 2 diabetes mellitus with foot ulcer: Secondary | ICD-10-CM | POA: Diagnosis not present

## 2024-02-17 DIAGNOSIS — T8744 Infection of amputation stump, left lower extremity: Secondary | ICD-10-CM | POA: Diagnosis not present

## 2024-02-17 DIAGNOSIS — E119 Type 2 diabetes mellitus without complications: Secondary | ICD-10-CM | POA: Diagnosis not present

## 2024-02-18 DIAGNOSIS — T8744 Infection of amputation stump, left lower extremity: Secondary | ICD-10-CM | POA: Diagnosis not present

## 2024-02-18 DIAGNOSIS — E119 Type 2 diabetes mellitus without complications: Secondary | ICD-10-CM | POA: Diagnosis not present

## 2024-02-19 DIAGNOSIS — T8744 Infection of amputation stump, left lower extremity: Secondary | ICD-10-CM | POA: Diagnosis not present

## 2024-02-19 DIAGNOSIS — E119 Type 2 diabetes mellitus without complications: Secondary | ICD-10-CM | POA: Diagnosis not present

## 2024-02-20 DIAGNOSIS — T8744 Infection of amputation stump, left lower extremity: Secondary | ICD-10-CM | POA: Diagnosis not present

## 2024-02-20 DIAGNOSIS — Z89422 Acquired absence of other left toe(s): Secondary | ICD-10-CM | POA: Diagnosis not present

## 2024-02-20 DIAGNOSIS — Z9889 Other specified postprocedural states: Secondary | ICD-10-CM | POA: Diagnosis not present

## 2024-02-20 DIAGNOSIS — E119 Type 2 diabetes mellitus without complications: Secondary | ICD-10-CM | POA: Diagnosis not present

## 2024-02-20 DIAGNOSIS — L97522 Non-pressure chronic ulcer of other part of left foot with fat layer exposed: Secondary | ICD-10-CM | POA: Diagnosis not present

## 2024-02-20 DIAGNOSIS — E11621 Type 2 diabetes mellitus with foot ulcer: Secondary | ICD-10-CM | POA: Diagnosis not present

## 2024-02-21 DIAGNOSIS — E119 Type 2 diabetes mellitus without complications: Secondary | ICD-10-CM | POA: Diagnosis not present

## 2024-02-21 DIAGNOSIS — T8744 Infection of amputation stump, left lower extremity: Secondary | ICD-10-CM | POA: Diagnosis not present

## 2024-02-28 ENCOUNTER — Ambulatory Visit (HOSPITAL_COMMUNITY)
Admission: RE | Admit: 2024-02-28 | Discharge: 2024-02-28 | Disposition: A | Discharge status: Home / Self Care | Source: Ambulatory Visit | Attending: Vascular Surgery | Admitting: Vascular Surgery

## 2024-02-28 ENCOUNTER — Encounter: Payer: Self-pay | Admitting: Physician Assistant

## 2024-02-28 ENCOUNTER — Ambulatory Visit (HOSPITAL_BASED_OUTPATIENT_CLINIC_OR_DEPARTMENT_OTHER)
Admit: 2024-02-28 | Discharge: 2024-02-28 | Disposition: A | Discharge status: Home / Self Care | Attending: Vascular Surgery | Admitting: Vascular Surgery

## 2024-02-28 ENCOUNTER — Ambulatory Visit: Attending: Vascular Surgery | Admitting: Physician Assistant

## 2024-02-28 VITALS — BP 107/70 | HR 93 | Temp 97.9°F | Wt 199.2 lb

## 2024-02-28 DIAGNOSIS — T8189XD Other complications of procedures, not elsewhere classified, subsequent encounter: Secondary | ICD-10-CM | POA: Diagnosis not present

## 2024-02-28 DIAGNOSIS — I70222 Atherosclerosis of native arteries of extremities with rest pain, left leg: Secondary | ICD-10-CM | POA: Diagnosis not present

## 2024-02-28 DIAGNOSIS — I739 Peripheral vascular disease, unspecified: Secondary | ICD-10-CM

## 2024-02-28 NOTE — Progress Notes (Signed)
 Office Note     CC:  follow up Requesting Provider:  Tonnie Fallow, MD  HPI: Clayton Everett is a 53 y.o. (24-Aug-1970) male who presents for follow up after Angiogram. On 01/05/24 he underwent Aortogram, Arteriogram of LLE with left SFA angioplasty and stenting. Noted to have patent three vessel runoff. He is s/p left 1st and 2nd toe amputations. He is followed by Dr. Burt with Podiatry.   He reports overall doing well. Denies any pain in his leg on ambulation or rest. No other tissue loss. He was last seen by Dr. Burt last Thursday 8/7 and has follow up again on 8/14. He initially had wound VAC in place but has been doing wet to dry dressing over the past week while Dr. Burt has been out of town. They will have VAC reapplied on Thursday. He is medically managed on Aspirin , Plavix and Statin. He continues to smoke.   Past Medical History:  Diagnosis Date   Asthma    Benign essential hypertension 06/30/2020   Chest pain 11/07/2020   Cocaine abuse in remission (HCC) 08/07/2020   Diabetes mellitus, type 2 (HCC) 06/30/2020   Marijuana abuse, continuous 08/07/2020   Mixed hyperlipidemia 06/30/2020   Obesity, diabetes, and hypertension syndrome (HCC) 09/08/2020   Peripheral vascular disease [facility])     Past Surgical History:  Procedure Laterality Date   ABDOMINAL AORTOGRAM N/A 01/05/2024   Procedure: ABDOMINAL AORTOGRAM;  Surgeon: Gretta Lonni PARAS, MD;  Location: [facility] INVASIVE CV LAB;  Service: Cardiovascular;  Laterality: N/A;   LOWER EXTREMITY ANGIOGRAPHY N/A 01/05/2024   Procedure: Lower Extremity Angiography;  Surgeon: Gretta Lonni PARAS, MD;  Location: [facility] INVASIVE CV LAB;  Service: Cardiovascular;  Laterality: N/A;   LOWER EXTREMITY INTERVENTION Left 01/05/2024   Procedure: ENDOVASCULAR REPAIR OF POPLITEAL ARTERY ANEURYSM;  Surgeon: Gretta Lonni PARAS, MD;  Location: [facility] INVASIVE CV LAB;  Service: Cardiovascular;  Laterality: Left;   none      Social History    Socioeconomic History   Marital status: Legally Separated    Spouse name: Not on file   Number of children: Not on file   Years of education: Not on file   Highest education level: Not on file  Occupational History   Occupation: Designer, television/film set  Tobacco Use   Smoking status: Some Days    Current packs/day: 1.50    Types: Cigarettes   Smokeless tobacco: Never  Vaping Use   Vaping status: Never Used  Substance and Sexual Activity   Alcohol use: Yes    Alcohol/week: 1.0 standard drink of alcohol    Types: 1 Cans of beer per week    Comment: weekly    Drug use: Yes    Frequency: 1.0 times per week    Types: Marijuana   Sexual activity: Yes    Partners: Female  Other Topics Concern   Not on file  Social History Narrative   Not on file   Social Drivers of Health   Financial Resource Strain: Medium Risk (12/22/2023)   Overall Financial Resource Strain (CARDIA)    Difficulty of Paying Living Expenses: Somewhat hard  Food Insecurity: Medium Risk (12/29/2023)   Received from [facility]   Hunger Vital Sign    Within the past 12 months, you worried that your food would run out before you got money to buy more: Sometimes true    Within the past 12 months, the food you bought just didn't last and you didn't have money to get more. : Never true  Transportation Needs: Unmet Transportation Needs (12/29/2023)   Received from [facility]   Transportation    In the past 12 months, has lack of reliable transportation kept you from medical appointments, meetings, work or from getting things needed for daily living? : Yes  Physical Activity: Inactive (12/22/2023)   Exercise Vital Sign    Days of Exercise per Week: 0 days    Minutes of Exercise per Session: 0 min  Stress: No Stress Concern Present (12/22/2023)   [employer] of Occupational Health - Occupational Stress Questionnaire    Feeling of Stress : Only a little  Social Connections: Socially Isolated (12/22/2023)   Social Connection  and Isolation Panel    Frequency of Communication with Friends and Family: Twice a week    Frequency of Social Gatherings with Friends and Family: Never    Attends Religious Services: Never    Database administrator or Organizations: No    Attends Banker Meetings: Never    Marital Status: Married  Catering manager Violence: Not At Risk (12/22/2023)   Humiliation, Afraid, Rape, and Kick questionnaire    Fear of Current or Ex-Partner: No    Emotionally Abused: No    Physically Abused: No    Sexually Abused: No    Family History  Problem Relation Age of Onset   Congestive Heart Failure Mother    Diabetes type II Other    COPD Other    Hypertension Other    Congestive Heart Failure Other    Diabetes Father     Current Outpatient Medications  Medication Sig Dispense Refill   amLODipine (NORVASC) 10 MG tablet Take 10 mg by mouth.     aspirin  81 MG chewable tablet Chew 81 mg by mouth daily.     clopidogrel (PLAVIX) 75 MG tablet Take 75 mg by mouth daily.     gabapentin (NEURONTIN) 100 MG capsule Take 400 mg by mouth 3 (three) times daily.     gabapentin (NEURONTIN) 300 MG capsule Take by mouth. (Patient not taking: Reported on 01/03/2024)     levofloxacin (LEVAQUIN) 750 MG tablet Take 750 mg by mouth daily.     linezolid (ZYVOX) 600 MG tablet Take 600 mg by mouth 2 (two) times daily.     lisinopril  (ZESTRIL ) 20 MG tablet Take 20 mg by mouth daily.     metFORMIN  (GLUCOPHAGE ) 500 MG tablet Take 500 mg by mouth 2 (two) times daily with a meal.     Oxycodone  HCl 10 MG TABS Take 10 mg by mouth every 4 (four) hours as needed (Pain).     rosuvastatin (CRESTOR) 20 MG tablet Take 20 mg by mouth daily.     senna-docusate (SENOKOT-S) 8.6-50 MG tablet Take 1 tablet by mouth at bedtime as needed for mild constipation or moderate constipation.     terbinafine (LAMISIL) 1 % cream Apply 1 Application topically at bedtime. Foot     No current facility-administered medications for this  visit.    Allergies  Allergen Reactions   Penicillins Shortness Of Breath     REVIEW OF SYSTEMS:  Negative unless noted in HPI [X]  denotes positive finding, [ ]  denotes negative finding Cardiac  Comments:  Chest pain or chest pressure:    Shortness of breath upon exertion:    Short of breath when lying flat:    Irregular heart rhythm:        Vascular    Pain in calf, thigh, or hip brought on by ambulation:  Pain in feet at night that wakes you up from your sleep:     Blood clot in your veins:    Leg swelling:         Pulmonary    Oxygen at home:    Productive cough:     Wheezing:         Neurologic    Sudden weakness in arms or legs:     Sudden numbness in arms or legs:     Sudden onset of difficulty speaking or slurred speech:    Temporary loss of vision in one eye:     Problems with dizziness:         Gastrointestinal    Blood in stool:     Vomited blood:         Genitourinary    Burning when urinating:     Blood in urine:        Psychiatric    Major depression:         Hematologic    Bleeding problems:    Problems with blood clotting too easily:        Skin    Rashes or ulcers:        Constitutional    Fever or chills:      PHYSICAL EXAMINATION:  Vitals:   02/28/24 1408  BP: 107/70  Pulse: 93  Temp: 97.9 F (36.6 C)  TempSrc: Temporal  Weight: 199 lb 3.2 oz (90.4 kg)    General:  WDWN in NAD; vital signs documented above Gait: uses Darco shoe HENT: WNL, normocephalic Pulmonary: normal non-labored breathing Cardiac: regular HR Vascular Exam/Pulses: 2+ femoral pulses bilaterally, doppler left DP/ PT signals; left foot dressed Musculoskeletal: no muscle wasting or atrophy  Neurologic: A&O X 3 Psychiatric:  The pt has Normal affect.   Non-Invasive Vascular Imaging:   ABI/TBIToday's ABIToday's TBIPrevious ABIPrevious TBI  +-------+-----------+-----------+------------+------------+  Right 1.23       0.81       0.43                       +-------+-----------+-----------+------------+------------+  Left  1.10       Amputation 1.12                      +-------+-----------+-----------+------------+------------+   Right ABIs appear essentially unchanged compared to prior study on 12/22/23.  Left ABIs appear increased compared to prior study on 12/22/23.     VAS US  lower extremity arterial duplex left: Summary:  Left: Patent stent with no evidence of stenosis in the superficial femoral artery.    ASSESSMENT/PLAN:: 53 y.o. male here for follow up after Angiogram. On 01/05/24 he underwent Aortogram, Arteriogram of LLE with left SFA angioplasty and stenting. Noted to have patent three vessel runoff. He is s/p left 1st and 2nd toe amputations. He is followed by Dr. Burt with Podiatry. Per patient and his wife the wounds are doing well. They have next follow up with Dr. Burt on Thursday 8/14 with plans for wound VAC application - ABI today show improvement in the LLE, right stable - Duplex shows patent left SFA/Ak popliteal stent with triphasic flow throughout left leg - continue aspirin , Plavix, Statin - Encourage smoking cessation - they will follow up with Dr. Burt for wound care - Follow up in 3 months with ABI and LLE duplex. They know to call for earlier follow up if any new or concerning symptoms   Teretha Damme, PA-C [facility] [phone]  On call MD:   Sheree

## 2024-02-29 LAB — VAS US ABI WITH/WO TBI
Left ABI: 1.1
Right ABI: 1.23

## 2024-03-01 DIAGNOSIS — E11621 Type 2 diabetes mellitus with foot ulcer: Secondary | ICD-10-CM | POA: Diagnosis not present

## 2024-03-01 DIAGNOSIS — L97522 Non-pressure chronic ulcer of other part of left foot with fat layer exposed: Secondary | ICD-10-CM | POA: Diagnosis not present

## 2024-03-02 ENCOUNTER — Other Ambulatory Visit: Payer: Self-pay

## 2024-03-02 DIAGNOSIS — I739 Peripheral vascular disease, unspecified: Secondary | ICD-10-CM

## 2024-03-08 DIAGNOSIS — L97522 Non-pressure chronic ulcer of other part of left foot with fat layer exposed: Secondary | ICD-10-CM | POA: Diagnosis not present

## 2024-03-08 DIAGNOSIS — E11621 Type 2 diabetes mellitus with foot ulcer: Secondary | ICD-10-CM | POA: Diagnosis not present

## 2024-03-15 DIAGNOSIS — L97522 Non-pressure chronic ulcer of other part of left foot with fat layer exposed: Secondary | ICD-10-CM | POA: Diagnosis not present

## 2024-03-15 DIAGNOSIS — E11621 Type 2 diabetes mellitus with foot ulcer: Secondary | ICD-10-CM | POA: Diagnosis not present

## 2024-03-22 DIAGNOSIS — E11621 Type 2 diabetes mellitus with foot ulcer: Secondary | ICD-10-CM | POA: Diagnosis not present

## 2024-03-22 DIAGNOSIS — L97522 Non-pressure chronic ulcer of other part of left foot with fat layer exposed: Secondary | ICD-10-CM | POA: Diagnosis not present

## 2024-03-29 DIAGNOSIS — E11621 Type 2 diabetes mellitus with foot ulcer: Secondary | ICD-10-CM | POA: Diagnosis not present

## 2024-03-29 DIAGNOSIS — L97522 Non-pressure chronic ulcer of other part of left foot with fat layer exposed: Secondary | ICD-10-CM | POA: Diagnosis not present

## 2024-04-05 DIAGNOSIS — E11621 Type 2 diabetes mellitus with foot ulcer: Secondary | ICD-10-CM | POA: Diagnosis not present

## 2024-04-05 DIAGNOSIS — L97522 Non-pressure chronic ulcer of other part of left foot with fat layer exposed: Secondary | ICD-10-CM | POA: Diagnosis not present

## 2024-04-12 DIAGNOSIS — L97522 Non-pressure chronic ulcer of other part of left foot with fat layer exposed: Secondary | ICD-10-CM | POA: Diagnosis not present

## 2024-04-12 DIAGNOSIS — E11621 Type 2 diabetes mellitus with foot ulcer: Secondary | ICD-10-CM | POA: Diagnosis not present

## 2024-04-26 DIAGNOSIS — E11621 Type 2 diabetes mellitus with foot ulcer: Secondary | ICD-10-CM | POA: Diagnosis not present

## 2024-04-26 DIAGNOSIS — Z89412 Acquired absence of left great toe: Secondary | ICD-10-CM | POA: Diagnosis not present

## 2024-04-26 DIAGNOSIS — L0390 Cellulitis, unspecified: Secondary | ICD-10-CM | POA: Diagnosis not present

## 2024-04-26 DIAGNOSIS — L97522 Non-pressure chronic ulcer of other part of left foot with fat layer exposed: Secondary | ICD-10-CM | POA: Diagnosis not present

## 2024-04-26 DIAGNOSIS — Z89422 Acquired absence of other left toe(s): Secondary | ICD-10-CM | POA: Diagnosis not present

## 2024-04-30 DIAGNOSIS — Z89412 Acquired absence of left great toe: Secondary | ICD-10-CM | POA: Diagnosis not present

## 2024-04-30 DIAGNOSIS — Z89422 Acquired absence of other left toe(s): Secondary | ICD-10-CM | POA: Diagnosis not present

## 2024-04-30 DIAGNOSIS — E11621 Type 2 diabetes mellitus with foot ulcer: Secondary | ICD-10-CM | POA: Diagnosis not present

## 2024-04-30 DIAGNOSIS — L97522 Non-pressure chronic ulcer of other part of left foot with fat layer exposed: Secondary | ICD-10-CM | POA: Diagnosis not present

## 2024-05-01 DIAGNOSIS — I872 Venous insufficiency (chronic) (peripheral): Secondary | ICD-10-CM | POA: Diagnosis not present

## 2024-05-03 DIAGNOSIS — Z89422 Acquired absence of other left toe(s): Secondary | ICD-10-CM | POA: Diagnosis not present

## 2024-05-03 DIAGNOSIS — Z89412 Acquired absence of left great toe: Secondary | ICD-10-CM | POA: Diagnosis not present

## 2024-05-03 DIAGNOSIS — E11621 Type 2 diabetes mellitus with foot ulcer: Secondary | ICD-10-CM | POA: Diagnosis not present

## 2024-05-03 DIAGNOSIS — L97522 Non-pressure chronic ulcer of other part of left foot with fat layer exposed: Secondary | ICD-10-CM | POA: Diagnosis not present

## 2024-05-07 DIAGNOSIS — I739 Peripheral vascular disease, unspecified: Secondary | ICD-10-CM | POA: Diagnosis not present

## 2024-05-07 DIAGNOSIS — Z89422 Acquired absence of other left toe(s): Secondary | ICD-10-CM | POA: Diagnosis not present

## 2024-05-07 DIAGNOSIS — Z89412 Acquired absence of left great toe: Secondary | ICD-10-CM | POA: Diagnosis not present

## 2024-05-07 DIAGNOSIS — E11621 Type 2 diabetes mellitus with foot ulcer: Secondary | ICD-10-CM | POA: Diagnosis not present

## 2024-05-10 DIAGNOSIS — S90822A Blister (nonthermal), left foot, initial encounter: Secondary | ICD-10-CM | POA: Diagnosis not present

## 2024-05-10 DIAGNOSIS — Z89412 Acquired absence of left great toe: Secondary | ICD-10-CM | POA: Diagnosis not present

## 2024-05-10 DIAGNOSIS — G8918 Other acute postprocedural pain: Secondary | ICD-10-CM | POA: Diagnosis not present

## 2024-05-10 DIAGNOSIS — Z89422 Acquired absence of other left toe(s): Secondary | ICD-10-CM | POA: Diagnosis not present

## 2024-05-14 DIAGNOSIS — Z9889 Other specified postprocedural states: Secondary | ICD-10-CM | POA: Diagnosis not present

## 2024-05-14 DIAGNOSIS — L97522 Non-pressure chronic ulcer of other part of left foot with fat layer exposed: Secondary | ICD-10-CM | POA: Diagnosis not present

## 2024-05-14 DIAGNOSIS — E11621 Type 2 diabetes mellitus with foot ulcer: Secondary | ICD-10-CM | POA: Diagnosis not present

## 2024-05-14 DIAGNOSIS — L0390 Cellulitis, unspecified: Secondary | ICD-10-CM | POA: Diagnosis not present

## 2024-05-14 DIAGNOSIS — Z89422 Acquired absence of other left toe(s): Secondary | ICD-10-CM | POA: Diagnosis not present

## 2024-05-15 DIAGNOSIS — I739 Peripheral vascular disease, unspecified: Secondary | ICD-10-CM | POA: Diagnosis not present

## 2024-05-15 DIAGNOSIS — E559 Vitamin D deficiency, unspecified: Secondary | ICD-10-CM | POA: Diagnosis not present

## 2024-05-15 DIAGNOSIS — F17200 Nicotine dependence, unspecified, uncomplicated: Secondary | ICD-10-CM | POA: Diagnosis not present

## 2024-05-15 DIAGNOSIS — E1165 Type 2 diabetes mellitus with hyperglycemia: Secondary | ICD-10-CM | POA: Diagnosis not present

## 2024-05-15 DIAGNOSIS — D62 Acute posthemorrhagic anemia: Secondary | ICD-10-CM | POA: Diagnosis not present

## 2024-05-15 DIAGNOSIS — R55 Syncope and collapse: Secondary | ICD-10-CM | POA: Diagnosis not present

## 2024-05-15 DIAGNOSIS — I10 Essential (primary) hypertension: Secondary | ICD-10-CM | POA: Diagnosis not present

## 2024-05-15 DIAGNOSIS — R748 Abnormal levels of other serum enzymes: Secondary | ICD-10-CM | POA: Diagnosis not present

## 2024-05-17 DIAGNOSIS — E11621 Type 2 diabetes mellitus with foot ulcer: Secondary | ICD-10-CM | POA: Diagnosis not present

## 2024-05-17 DIAGNOSIS — L97522 Non-pressure chronic ulcer of other part of left foot with fat layer exposed: Secondary | ICD-10-CM | POA: Diagnosis not present

## 2024-05-21 DIAGNOSIS — E11621 Type 2 diabetes mellitus with foot ulcer: Secondary | ICD-10-CM | POA: Diagnosis not present

## 2024-05-21 DIAGNOSIS — M79672 Pain in left foot: Secondary | ICD-10-CM | POA: Diagnosis not present

## 2024-05-21 DIAGNOSIS — L97522 Non-pressure chronic ulcer of other part of left foot with fat layer exposed: Secondary | ICD-10-CM | POA: Diagnosis not present

## 2024-05-21 DIAGNOSIS — S90829D Blister (nonthermal), unspecified foot, subsequent encounter: Secondary | ICD-10-CM | POA: Diagnosis not present

## 2024-06-04 NOTE — Progress Notes (Deleted)
 Office Note     CC:  follow up Requesting Provider:  Tonnie Fallow, MD  HPI: Clayton Everett is a 53 y.o. ([birth year]) male who presents for surveillance follow up of PAD. He most recently underwent Aortogram, Arteriogram of LLE with left SFA angioplasty and stenting. Noted to have patent three vessel runoff. He is s/p left 1st and 2nd toe amputations. He is followed by Dr. Burt with Podiatry.   At his last visit in August he was doing well. He reported that his toe amputation sites were healing well. He was not having any claudication or rest pain. His non invasive studies were reassuring for patency of his left SFA and adequate perfusion of his LLE.   Today he reports ***  He is medically managed on Aspirin , Plavix and Statin. He continues to smoke.   Past Medical History:  Diagnosis Date   Asthma    Benign essential hypertension 06/30/2020   Chest pain 11/07/2020   Cocaine abuse in remission (HCC) 08/07/2020   Diabetes mellitus, type 2 (HCC) 06/30/2020   Marijuana abuse, continuous 08/07/2020   Mixed hyperlipidemia 06/30/2020   Obesity, diabetes, and hypertension syndrome (HCC) 09/08/2020   Peripheral vascular disease     Past Surgical History:  Procedure Laterality Date   ABDOMINAL AORTOGRAM N/A 01/05/2024   Procedure: ABDOMINAL AORTOGRAM;  Surgeon: Gretta Lonni PARAS, MD;  Location: [facility] INVASIVE CV LAB;  Service: Cardiovascular;  Laterality: N/A;   LOWER EXTREMITY ANGIOGRAPHY N/A 01/05/2024   Procedure: Lower Extremity Angiography;  Surgeon: Gretta Lonni PARAS, MD;  Location: [facility] INVASIVE CV LAB;  Service: Cardiovascular;  Laterality: N/A;   LOWER EXTREMITY INTERVENTION Left 01/05/2024   Procedure: ENDOVASCULAR REPAIR OF POPLITEAL ARTERY ANEURYSM;  Surgeon: Gretta Lonni PARAS, MD;  Location: [facility] INVASIVE CV LAB;  Service: Cardiovascular;  Laterality: Left;   none      Social History   Socioeconomic History   Marital status: Legally Separated    Spouse name: Not  on file   Number of children: Not on file   Years of education: Not on file   Highest education level: Not on file  Occupational History   Occupation: Designer, Television/film Set  Tobacco Use   Smoking status: Some Days    Current packs/day: 1.50    Types: Cigarettes   Smokeless tobacco: Never  Vaping Use   Vaping status: Never Used  Substance and Sexual Activity   Alcohol use: Yes    Alcohol/week: 1.0 standard drink of alcohol    Types: 1 Cans of beer per week    Comment: weekly    Drug use: Yes    Frequency: 1.0 times per week    Types: Marijuana   Sexual activity: Yes    Partners: Female  Other Topics Concern   Not on file  Social History Narrative   Not on file   Social Drivers of Health   Financial Resource Strain: Medium Risk (12/22/2023)   Overall Financial Resource Strain (CARDIA)    Difficulty of Paying Living Expenses: Somewhat hard  Food Insecurity: Medium Risk (12/29/2023)   Received from [facility]   Hunger Vital Sign    Within the past 12 months, you worried that your food would run out before you got money to buy more: Sometimes true    Within the past 12 months, the food you bought just didn't last and you didn't have money to get more. : Never true  Transportation Needs: Unmet Transportation Needs (12/29/2023)   Received from [facility]  In the past 12 months, has lack of reliable transportation kept you from medical appointments, meetings, work or from getting things needed for daily living? : Yes  Physical Activity: Inactive (12/22/2023)   Exercise Vital Sign    Days of Exercise per Week: 0 days    Minutes of Exercise per Session: 0 min  Stress: No Stress Concern Present (12/22/2023)   [employer] of Occupational Health - Occupational Stress Questionnaire    Feeling of Stress : Only a little  Social Connections: Socially Isolated (12/22/2023)   Social Connection and Isolation Panel    Frequency of Communication with Friends and Family: Twice a  week    Frequency of Social Gatherings with Friends and Family: Never    Attends Religious Services: Never    Database Administrator or Organizations: No    Attends Banker Meetings: Never    Marital Status: Married  Catering Manager Violence: Not At Risk (12/22/2023)   Humiliation, Afraid, Rape, and Kick questionnaire    Fear of Current or Ex-Partner: No    Emotionally Abused: No    Physically Abused: No    Sexually Abused: No   *** Family History  Problem Relation Age of Onset   Congestive Heart Failure Mother    Diabetes type II Other    COPD Other    Hypertension Other    Congestive Heart Failure Other    Diabetes Father     Current Outpatient Medications  Medication Sig Dispense Refill   amLODipine (NORVASC) 10 MG tablet Take 10 mg by mouth.     aspirin  81 MG chewable tablet Chew 81 mg by mouth daily.     clopidogrel (PLAVIX) 75 MG tablet Take 75 mg by mouth daily.     gabapentin (NEURONTIN) 100 MG capsule Take 400 mg by mouth 3 (three) times daily.     gabapentin (NEURONTIN) 300 MG capsule Take by mouth. (Patient not taking: Reported on 01/03/2024)     levofloxacin (LEVAQUIN) 750 MG tablet Take 750 mg by mouth daily.     linezolid (ZYVOX) 600 MG tablet Take 600 mg by mouth 2 (two) times daily.     lisinopril  (ZESTRIL ) 20 MG tablet Take 20 mg by mouth daily.     metFORMIN  (GLUCOPHAGE ) 500 MG tablet Take 500 mg by mouth 2 (two) times daily with a meal.     Oxycodone  HCl 10 MG TABS Take 10 mg by mouth every 4 (four) hours as needed (Pain).     rosuvastatin (CRESTOR) 20 MG tablet Take 20 mg by mouth daily.     senna-docusate (SENOKOT-S) 8.6-50 MG tablet Take 1 tablet by mouth at bedtime as needed for mild constipation or moderate constipation.     terbinafine (LAMISIL) 1 % cream Apply 1 Application topically at bedtime. Foot     No current facility-administered medications for this visit.    Allergies  Allergen Reactions   Penicillins Shortness Of Breath      REVIEW OF SYSTEMS:  Negative unless noted in HPI [X]  denotes positive finding, [ ]  denotes negative finding Cardiac  Comments:  Chest pain or chest pressure:    Shortness of breath upon exertion:    Short of breath when lying flat:    Irregular heart rhythm:        Vascular    Pain in calf, thigh, or hip brought on by ambulation:    Pain in feet at night that wakes you up from your sleep:     Blood  clot in your veins:    Leg swelling:         Pulmonary    Oxygen at home:    Productive cough:     Wheezing:         Neurologic    Sudden weakness in arms or legs:     Sudden numbness in arms or legs:     Sudden onset of difficulty speaking or slurred speech:    Temporary loss of vision in one eye:     Problems with dizziness:         Gastrointestinal    Blood in stool:     Vomited blood:         Genitourinary    Burning when urinating:     Blood in urine:        Psychiatric    Major depression:         Hematologic    Bleeding problems:    Problems with blood clotting too easily:        Skin    Rashes or ulcers:        Constitutional    Fever or chills:      PHYSICAL EXAMINATION:  There were no vitals filed for this visit.***  General:  WDWN in NAD; vital signs documented above Gait: Not observed HENT: WNL, normocephalic Pulmonary: normal non-labored breathing Cardiac: regular HR Abdomen: soft Vascular Exam/Pulses: *** Extremities: {With/Without:20273} ischemic changes, {With/Without:20273} Gangrene , {With/Without:20273} cellulitis; {With/Without:20273} open wounds;  Musculoskeletal: no muscle wasting or atrophy  Neurologic: A&O X 3 Psychiatric:  The pt has Normal affect.   Non-Invasive Vascular Imaging:   ***    ASSESSMENT/PLAN:: 53 y.o. male here for follow up for ***   -***   Teretha Damme, PA-C [facility] [phone]  Clinic MD:   ***

## 2024-06-05 ENCOUNTER — Ambulatory Visit

## 2024-06-05 ENCOUNTER — Ambulatory Visit (HOSPITAL_COMMUNITY)
